# Patient Record
Sex: Female | Born: 1950 | Race: White | Hispanic: No | Marital: Married | Smoking: Never smoker
Health system: Southern US, Community
[De-identification: ages and names within clinical notes are randomized; demographics above are authoritative.]

## PROBLEM LIST (undated history)

## (undated) DIAGNOSIS — E785 Hyperlipidemia, unspecified: Secondary | ICD-10-CM

## (undated) DIAGNOSIS — G4700 Insomnia, unspecified: Secondary | ICD-10-CM

## (undated) DIAGNOSIS — Z86010 Personal history of colonic polyps: Principal | ICD-10-CM

## (undated) DIAGNOSIS — E559 Vitamin D deficiency, unspecified: Secondary | ICD-10-CM

## (undated) DIAGNOSIS — G43909 Migraine, unspecified, not intractable, without status migrainosus: Secondary | ICD-10-CM

## (undated) DIAGNOSIS — Z973 Presence of spectacles and contact lenses: Secondary | ICD-10-CM

## (undated) DIAGNOSIS — N1831 Chronic kidney disease, stage 3a: Secondary | ICD-10-CM

## (undated) DIAGNOSIS — E039 Hypothyroidism, unspecified: Secondary | ICD-10-CM

## (undated) DIAGNOSIS — K5790 Diverticulosis of intestine, part unspecified, without perforation or abscess without bleeding: Secondary | ICD-10-CM

## (undated) DIAGNOSIS — R42 Dizziness and giddiness: Secondary | ICD-10-CM

## (undated) DIAGNOSIS — K5792 Diverticulitis of intestine, part unspecified, without perforation or abscess without bleeding: Secondary | ICD-10-CM

## (undated) HISTORY — DX: Migraine, unspecified, not intractable, without status migrainosus: G43.909

## (undated) HISTORY — PX: ABDOMINAL HYSTERECTOMY: SUR658

## (undated) HISTORY — PX: COLONOSCOPY: SHX174

## (undated) HISTORY — DX: Personal history of colonic polyps: Z86.010

## (undated) HISTORY — PX: BREAST SURGERY: SHX581

## (undated) HISTORY — PX: TUBAL LIGATION: SHX77

## (undated) HISTORY — PX: TONSILLECTOMY: SUR1361

## (undated) HISTORY — DX: Insomnia, unspecified: G47.00

---
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
## 1999-06-17 ENCOUNTER — Ambulatory Visit (HOSPITAL_BASED_OUTPATIENT_CLINIC_OR_DEPARTMENT_OTHER): Admission: RE | Admit: 1999-06-17 | Discharge: 1999-06-17 | Payer: Self-pay | Admitting: General Surgery

## 1999-06-17 ENCOUNTER — Encounter: Payer: Self-pay | Admitting: General Surgery

## 2000-12-28 ENCOUNTER — Encounter (INDEPENDENT_AMBULATORY_CARE_PROVIDER_SITE_OTHER): Payer: Self-pay | Admitting: Specialist

## 2000-12-28 ENCOUNTER — Other Ambulatory Visit: Admission: RE | Admit: 2000-12-28 | Discharge: 2000-12-28 | Payer: Self-pay | Admitting: Obstetrics and Gynecology

## 2006-09-25 ENCOUNTER — Ambulatory Visit: Payer: Self-pay | Admitting: Internal Medicine

## 2006-10-12 ENCOUNTER — Ambulatory Visit: Payer: Self-pay | Admitting: Internal Medicine

## 2006-10-12 ENCOUNTER — Encounter (INDEPENDENT_AMBULATORY_CARE_PROVIDER_SITE_OTHER): Payer: Self-pay | Admitting: Specialist

## 2006-10-12 DIAGNOSIS — Z860100 Personal history of colon polyps, unspecified: Secondary | ICD-10-CM

## 2006-10-12 HISTORY — DX: Personal history of colonic polyps: Z86.010

## 2006-10-12 HISTORY — DX: Personal history of colon polyps, unspecified: Z86.0100

## 2007-06-29 ENCOUNTER — Ambulatory Visit: Payer: Self-pay

## 2010-07-18 DIAGNOSIS — G43709 Chronic migraine without aura, not intractable, without status migrainosus: Secondary | ICD-10-CM | POA: Insufficient documentation

## 2010-07-26 DIAGNOSIS — R0683 Snoring: Secondary | ICD-10-CM | POA: Insufficient documentation

## 2010-07-26 DIAGNOSIS — D126 Benign neoplasm of colon, unspecified: Secondary | ICD-10-CM | POA: Insufficient documentation

## 2010-07-26 DIAGNOSIS — N939 Abnormal uterine and vaginal bleeding, unspecified: Secondary | ICD-10-CM | POA: Insufficient documentation

## 2010-09-14 DIAGNOSIS — G4730 Sleep apnea, unspecified: Secondary | ICD-10-CM | POA: Insufficient documentation

## 2010-09-15 DIAGNOSIS — E559 Vitamin D deficiency, unspecified: Secondary | ICD-10-CM | POA: Insufficient documentation

## 2010-10-06 ENCOUNTER — Ambulatory Visit (HOSPITAL_COMMUNITY): Admission: RE | Admit: 2010-10-06 | Discharge: 2010-10-06 | Payer: Self-pay | Admitting: Obstetrics and Gynecology

## 2011-05-12 ENCOUNTER — Other Ambulatory Visit: Payer: Self-pay | Admitting: Otolaryngology

## 2011-07-20 DIAGNOSIS — R946 Abnormal results of thyroid function studies: Secondary | ICD-10-CM | POA: Insufficient documentation

## 2011-11-23 ENCOUNTER — Ambulatory Visit: Payer: Self-pay | Admitting: Obstetrics & Gynecology

## 2011-11-29 ENCOUNTER — Encounter: Payer: Self-pay | Admitting: Internal Medicine

## 2011-11-30 ENCOUNTER — Ambulatory Visit: Payer: Self-pay | Admitting: Obstetrics & Gynecology

## 2011-12-01 LAB — PATHOLOGY REPORT

## 2012-08-21 ENCOUNTER — Encounter: Payer: Self-pay | Admitting: Internal Medicine

## 2014-06-22 DIAGNOSIS — B029 Zoster without complications: Secondary | ICD-10-CM | POA: Insufficient documentation

## 2014-07-31 ENCOUNTER — Telehealth: Payer: Self-pay | Admitting: Internal Medicine

## 2014-07-31 DIAGNOSIS — E669 Obesity, unspecified: Secondary | ICD-10-CM | POA: Insufficient documentation

## 2014-07-31 NOTE — Telephone Encounter (Signed)
Patient was due in 2012.  There is a recall letter in the system.   Left message for patient to call back

## 2014-07-31 NOTE — Telephone Encounter (Signed)
I spoke with the patient.  She wants an am appt, but is not able to come on the days any of the am appts are available.  She wants to wait until December.  She will call back in a few weeks when the December schedule is opened.

## 2014-12-08 ENCOUNTER — Encounter: Payer: Self-pay | Admitting: Internal Medicine

## 2015-01-28 ENCOUNTER — Ambulatory Visit (AMBULATORY_SURGERY_CENTER): Payer: Self-pay

## 2015-01-28 VITALS — Ht 67.0 in | Wt 205.6 lb

## 2015-01-28 DIAGNOSIS — Z860100 Personal history of colon polyps, unspecified: Secondary | ICD-10-CM

## 2015-01-28 NOTE — Progress Notes (Signed)
No allergies to eggs or soy No diet/weight loss meds No home oxygen No past problems with anesthesia  Has email  Emmi instructions given for colonoscopy 

## 2015-02-11 ENCOUNTER — Ambulatory Visit (AMBULATORY_SURGERY_CENTER): Payer: Managed Care, Other (non HMO) | Admitting: Internal Medicine

## 2015-02-11 ENCOUNTER — Encounter: Payer: Self-pay | Admitting: Internal Medicine

## 2015-02-11 VITALS — BP 116/70 | HR 57 | Temp 97.3°F | Resp 27 | Ht 67.0 in | Wt 205.0 lb

## 2015-02-11 DIAGNOSIS — Z86010 Personal history of colonic polyps: Secondary | ICD-10-CM

## 2015-02-11 DIAGNOSIS — K5730 Diverticulosis of large intestine without perforation or abscess without bleeding: Secondary | ICD-10-CM

## 2015-02-11 MED ORDER — SODIUM CHLORIDE 0.9 % IV SOLN: 500.0000 mL | INTRAVENOUS | Status: DC

## 2015-02-11 NOTE — Op Note (Signed)
[facility]  [address] [place], [address]   COLONOSCOPY PROCEDURE REPORT  PATIENT: Abigail Mitchell, Abigail Mitchell  MR#: [id number] BIRTHDATE: 1951-03-30 , 59  yrs. old GENDER: female ENDOSCOPIST: Gatha Mayer, MD, [facility] PROCEDURE DATE:  02/11/2015 PROCEDURE:   Colonoscopy, surveillance First Screening Colonoscopy - Avg.  risk and is 50 yrs.  old or older - No.  Prior Negative Screening - Now for repeat screening. N/A  History of Adenoma - Now for follow-up colonoscopy & has been > or = to 3 yrs.  Yes hx of adenoma.  Has been 3 or more years since last colonoscopy. ASA CLASS:   Class II INDICATIONS:Surveillance due to prior colonic neoplasia and PH Colon Adenoma. MEDICATIONS: Propofol 330 mg IV and Monitored anesthesia care  DESCRIPTION OF PROCEDURE:   After the risks benefits and alternatives of the procedure were thoroughly explained, informed consent was obtained.  The digital rectal exam revealed no abnormalities of the rectum.   The LB [id number]  endoscope was introduced through the anus and advanced to the cecum, which was identified by both the appendix and ileocecal valve. No adverse events experienced.   The quality of the prep was excellent. (MiraLax was used)  The instrument was then slowly withdrawn as the colon was fully examined.      COLON FINDINGS: There was severe diverticulosis noted in the sigmoid colon.   The examination was otherwise normal.  Retroflexed views revealed no abnormalities. The time to cecum = 3.2 Withdrawal time = 10.3   The scope was withdrawn and the procedure completed. COMPLICATIONS: There were no immediate complications.  ENDOSCOPIC IMPRESSION: 1.   Severe diverticulosis was noted in the sigmoid colon 2.   The examination was otherwise normal  RECOMMENDATIONS: Repeat colonoscopy 10 years.  (hx diminutive adenomas 2007)  eSigned:  Gatha Mayer, MD, [facility] 02/11/2015 10:00 AM   cc: Crist Infante, MD and The  Patient

## 2015-02-11 NOTE — Patient Instructions (Signed)
Discharge instructions given. Handout on diverticulosis. Resume previous medications. YOU HAD AN ENDOSCOPIC PROCEDURE TODAY AT THE [facility]:   Refer to the procedure report that was given to you for any specific questions about what was found during the examination.  If the procedure report does not answer your questions, please call your gastroenterologist to clarify.  If you requested that your care partner not be given the details of your procedure findings, then the procedure report has been included in a sealed envelope for you to review at your convenience later.  YOU SHOULD EXPECT: Some feelings of bloating in the abdomen. Passage of more gas than usual.  Walking can help get rid of the air that was put into your GI tract during the procedure and reduce the bloating. If you had a lower endoscopy (such as a colonoscopy or flexible sigmoidoscopy) you may notice spotting of blood in your stool or on the toilet paper. If you underwent a bowel prep for your procedure, you may not have a normal bowel movement for a few days.  Please Note:  You might notice some irritation and congestion in your nose or some drainage.  This is from the oxygen used during your procedure.  There is no need for concern and it should clear up in a day or so.  SYMPTOMS TO REPORT IMMEDIATELY:   Following lower endoscopy (colonoscopy or flexible sigmoidoscopy):  Excessive amounts of blood in the stool  Significant tenderness or worsening of abdominal pains  Swelling of the abdomen that is new, acute  Fever of 100F or higher  For urgent or emergent issues, a gastroenterologist can be reached at any hour by calling [phone].   DIET: Your first meal following the procedure should be a small meal and then it is ok to progress to your normal diet. Heavy or fried foods are harder to digest and may make you feel nauseous or bloated.  Likewise, meals heavy in dairy and vegetables can increase bloating.   Drink plenty of fluids but you should avoid alcoholic beverages for 24 hours.  ACTIVITY:  You should plan to take it easy for the rest of today and you should NOT DRIVE or use heavy machinery until tomorrow (because of the sedation medicines used during the test).    FOLLOW UP: Our staff will call the number listed on your records the next business day following your procedure to check on you and address any questions or concerns that you may have regarding the information given to you following your procedure. If we do not reach you, we will leave a message.  However, if you are feeling well and you are not experiencing any problems, there is no need to return our call.  We will assume that you have returned to your regular daily activities without incident.  If any biopsies were taken you will be contacted by phone or by letter within the next 1-3 weeks.  Please call us at [phone] if you have not heard about the biopsies in 3 weeks.    SIGNATURES/CONFIDENTIALITY: You and/or your care partner have signed paperwork which will be entered into your electronic medical record.  These signatures attest to the fact that that the information above on your After Visit Summary has been reviewed and is understood.  Full responsibility of the confidentiality of this discharge information lies with you and/or your care-partner. 

## 2015-02-11 NOTE — Progress Notes (Signed)
No egg or soy allergy 

## 2015-02-11 NOTE — Progress Notes (Signed)
A/ox3, pleased with MAC, report to RN 

## 2015-02-12 ENCOUNTER — Telehealth: Payer: Self-pay | Admitting: *Deleted

## 2015-02-12 NOTE — Telephone Encounter (Signed)
  Follow up Call-  Call back number 02/11/2015  Post procedure Call Back phone  # [phone]  Permission to leave phone message Yes     Patient questions:  Do you have a fever, pain , or abdominal swelling? No. Pain Score  0 *  Have you tolerated food without any problems? Yes.    Have you been able to return to your normal activities? Yes.    Do you have any questions about your discharge instructions: Diet   No. Medications  No. Follow up visit  No.  Do you have questions or concerns about your Care? No.  Actions: * If pain score is 4 or above: No action needed, pain <4.

## 2015-03-21 NOTE — Op Note (Signed)
PATIENT NAME:  Abigail Mitchell, Abigail Mitchell MR#:  [id number] DATE OF BIRTH:  [birth year]  DATE OF PROCEDURE:  11/30/2011  PREOPERATIVE DIAGNOSES: 1. Postmenopausal bleeding. 2. Fibroids. 3. AGUS. 4. Endometrial thickening.   POSTOPERATIVE DIAGNOSES: 1. Postmenopausal bleeding. 2. Fibroids. 3. AGUS. 4. Endometrial thickening.   PROCEDURES:  1. Complete laparoscopic hysterectomy. 2. Bilateral salpingo-oophorectomy.   SURGEON: Glean Salen, MD   ASSISTANT: Malachy Mood, MD    ANESTHESIA: General.   ESTIMATED BLOOD LOSS: 25 mL.   COMPLICATIONS: None.   FINDINGS: Small fibroids on uterus, atrophic appearing ovaries.   DISPOSITION: To recovery room in stable condition.   TECHNIQUE: The patient is prepped and draped in the usual sterile fashion after adequate anesthesia is obtained in the dorsal lithotomy position. A Foley catheter is inserted. A V-care device is placed on the cervix for uterine manipulation purposes after the uterus is sounded to 7 cm.   Attention is then turned to the abdomen where a Veress needle is inserted through a 5 mm infraumbilical incision after Marcaine is used to anesthetize the skin. Veress needle placement is confirmed using the hanging drop technique and the abdomen is then insufflated with CO2 gas. A 5 mm trocar is then inserted under direct visualization with the laparoscope with no injuries or bleeding noted. The patient is placed in Trendelenburg positioning. An 11 mm trocar is placed in the right lower quadrant lateral to the inferior epigastric blood vessels and a 5 mm trocar is placed in the left lower quadrant lateral to the inferior epigastric blood vessels with no injuries or bleeding noted. The 5 mm Harmonic scalpel was used to dissect some adhesions around the left-sided sigmoid colon to pelvic sidewall and near the adnexa for complete visualization of the pelvic anatomy.   The 5 mm Harmonic scalpel is used to carefully coagulate and dissect the  infundibulopelvic blood vessels and ligaments to completely amputate the uterus, ovaries, and fallopian tubes. The round ligaments are dissected as well. Once the dissection reaches the uterine arteries towards the base of the cervix, they are carefully coagulated using the bipolar cautery device. The V-care device is noted in the cervicovaginal junction tenting the tissues and a careful incision made circumferentially using the Harmonic scalpel to completely amputate the uterus along with the adnexa. It is then removed vaginally.   A sponge is placed in the vagina to maintain pneumoperitoneum. Using the EndoStitch device and Polysorb sutures, the vaginal cuff is closed in an interrupted fashion with five sutures placed. A vaginal exam reveals excellent closure with no loss of pneumoperitoneum. The pelvic cavity is irrigated and excellent hemostasis is visualized. The bladder is filled with 120 mL of saline with no leakage and no bleeding noted in the urine. The patient is leveled, gas is expelled, trocars are removed. The skin is closed with Dermabond. The patient goes to the recovery room in stable condition. All sponge, instrument, and needle counts are correct.   ____________________________ R. Barnett Applebaum, MD rph:drc D: 11/30/2011 10:40:14 ET T: 11/30/2011 12:12:34 ET JOB#: [id number]  cc: Glean Salen, MD, <Dictator> Gae Dry MD ELECTRONICALLY SIGNED 11/30/2011 13:27

## 2015-05-24 DIAGNOSIS — H8110 Benign paroxysmal vertigo, unspecified ear: Secondary | ICD-10-CM | POA: Insufficient documentation

## 2015-09-07 DIAGNOSIS — G500 Trigeminal neuralgia: Secondary | ICD-10-CM | POA: Insufficient documentation

## 2015-12-02 DIAGNOSIS — Z0000 Encounter for general adult medical examination without abnormal findings: Secondary | ICD-10-CM | POA: Insufficient documentation

## 2015-12-02 DIAGNOSIS — R946 Abnormal results of thyroid function studies: Secondary | ICD-10-CM | POA: Diagnosis not present

## 2015-12-02 DIAGNOSIS — E559 Vitamin D deficiency, unspecified: Secondary | ICD-10-CM | POA: Diagnosis not present

## 2015-12-21 DIAGNOSIS — D126 Benign neoplasm of colon, unspecified: Secondary | ICD-10-CM | POA: Diagnosis not present

## 2015-12-21 DIAGNOSIS — E559 Vitamin D deficiency, unspecified: Secondary | ICD-10-CM | POA: Diagnosis not present

## 2015-12-21 DIAGNOSIS — Z1389 Encounter for screening for other disorder: Secondary | ICD-10-CM | POA: Diagnosis not present

## 2015-12-21 DIAGNOSIS — Z0000 Encounter for general adult medical examination without abnormal findings: Secondary | ICD-10-CM | POA: Diagnosis not present

## 2015-12-21 DIAGNOSIS — R946 Abnormal results of thyroid function studies: Secondary | ICD-10-CM | POA: Diagnosis not present

## 2015-12-21 DIAGNOSIS — B029 Zoster without complications: Secondary | ICD-10-CM | POA: Diagnosis not present

## 2015-12-21 DIAGNOSIS — M899 Disorder of bone, unspecified: Secondary | ICD-10-CM | POA: Insufficient documentation

## 2015-12-21 DIAGNOSIS — Z6833 Body mass index (BMI) 33.0-33.9, adult: Secondary | ICD-10-CM | POA: Diagnosis not present

## 2015-12-21 DIAGNOSIS — H8110 Benign paroxysmal vertigo, unspecified ear: Secondary | ICD-10-CM | POA: Diagnosis not present

## 2015-12-21 DIAGNOSIS — G43709 Chronic migraine without aura, not intractable, without status migrainosus: Secondary | ICD-10-CM | POA: Diagnosis not present

## 2015-12-21 DIAGNOSIS — Z90722 Acquired absence of ovaries, bilateral: Secondary | ICD-10-CM | POA: Insufficient documentation

## 2015-12-21 DIAGNOSIS — G500 Trigeminal neuralgia: Secondary | ICD-10-CM | POA: Diagnosis not present

## 2015-12-21 DIAGNOSIS — G4739 Other sleep apnea: Secondary | ICD-10-CM | POA: Diagnosis not present

## 2015-12-21 DIAGNOSIS — E668 Other obesity: Secondary | ICD-10-CM | POA: Diagnosis not present

## 2016-01-12 DIAGNOSIS — Z1212 Encounter for screening for malignant neoplasm of rectum: Secondary | ICD-10-CM | POA: Diagnosis not present

## 2016-02-15 DIAGNOSIS — R52 Pain, unspecified: Secondary | ICD-10-CM | POA: Insufficient documentation

## 2016-02-15 DIAGNOSIS — J029 Acute pharyngitis, unspecified: Secondary | ICD-10-CM | POA: Insufficient documentation

## 2016-02-15 DIAGNOSIS — R509 Fever, unspecified: Secondary | ICD-10-CM | POA: Diagnosis not present

## 2016-02-15 DIAGNOSIS — Z6834 Body mass index (BMI) 34.0-34.9, adult: Secondary | ICD-10-CM | POA: Diagnosis not present

## 2016-02-15 DIAGNOSIS — J020 Streptococcal pharyngitis: Secondary | ICD-10-CM | POA: Diagnosis not present

## 2016-02-23 DIAGNOSIS — H47093 Other disorders of optic nerve, not elsewhere classified, bilateral: Secondary | ICD-10-CM | POA: Diagnosis not present

## 2016-03-29 DIAGNOSIS — Z803 Family history of malignant neoplasm of breast: Secondary | ICD-10-CM | POA: Diagnosis not present

## 2016-03-29 DIAGNOSIS — Z1231 Encounter for screening mammogram for malignant neoplasm of breast: Secondary | ICD-10-CM | POA: Diagnosis not present

## 2016-05-15 DIAGNOSIS — N390 Urinary tract infection, site not specified: Secondary | ICD-10-CM | POA: Diagnosis not present

## 2016-05-15 DIAGNOSIS — R8299 Other abnormal findings in urine: Secondary | ICD-10-CM | POA: Diagnosis not present

## 2016-05-15 DIAGNOSIS — R300 Dysuria: Secondary | ICD-10-CM | POA: Insufficient documentation

## 2016-05-17 DIAGNOSIS — Z6833 Body mass index (BMI) 33.0-33.9, adult: Secondary | ICD-10-CM | POA: Diagnosis not present

## 2016-05-17 DIAGNOSIS — K5732 Diverticulitis of large intestine without perforation or abscess without bleeding: Secondary | ICD-10-CM | POA: Diagnosis not present

## 2016-05-17 DIAGNOSIS — R1032 Left lower quadrant pain: Secondary | ICD-10-CM | POA: Diagnosis not present

## 2016-05-17 DIAGNOSIS — R1031 Right lower quadrant pain: Secondary | ICD-10-CM | POA: Diagnosis not present

## 2016-05-17 DIAGNOSIS — Z1389 Encounter for screening for other disorder: Secondary | ICD-10-CM | POA: Diagnosis not present

## 2016-05-17 DIAGNOSIS — R8299 Other abnormal findings in urine: Secondary | ICD-10-CM | POA: Diagnosis not present

## 2016-08-30 DIAGNOSIS — M859 Disorder of bone density and structure, unspecified: Secondary | ICD-10-CM | POA: Diagnosis not present

## 2016-10-03 DIAGNOSIS — R1032 Left lower quadrant pain: Secondary | ICD-10-CM | POA: Diagnosis not present

## 2016-10-03 DIAGNOSIS — K5730 Diverticulosis of large intestine without perforation or abscess without bleeding: Secondary | ICD-10-CM | POA: Diagnosis not present

## 2016-10-03 DIAGNOSIS — R1031 Right lower quadrant pain: Secondary | ICD-10-CM | POA: Diagnosis not present

## 2016-10-03 DIAGNOSIS — Z6833 Body mass index (BMI) 33.0-33.9, adult: Secondary | ICD-10-CM | POA: Diagnosis not present

## 2016-10-04 DIAGNOSIS — D539 Nutritional anemia, unspecified: Secondary | ICD-10-CM | POA: Insufficient documentation

## 2016-12-18 DIAGNOSIS — R946 Abnormal results of thyroid function studies: Secondary | ICD-10-CM | POA: Diagnosis not present

## 2016-12-18 DIAGNOSIS — E559 Vitamin D deficiency, unspecified: Secondary | ICD-10-CM | POA: Diagnosis not present

## 2016-12-18 DIAGNOSIS — R8299 Other abnormal findings in urine: Secondary | ICD-10-CM | POA: Diagnosis not present

## 2016-12-18 DIAGNOSIS — E538 Deficiency of other specified B group vitamins: Secondary | ICD-10-CM | POA: Diagnosis not present

## 2016-12-18 DIAGNOSIS — Z0000 Encounter for general adult medical examination without abnormal findings: Secondary | ICD-10-CM | POA: Diagnosis not present

## 2016-12-29 DIAGNOSIS — I809 Phlebitis and thrombophlebitis of unspecified site: Secondary | ICD-10-CM | POA: Insufficient documentation

## 2016-12-29 DIAGNOSIS — Z6834 Body mass index (BMI) 34.0-34.9, adult: Secondary | ICD-10-CM | POA: Diagnosis not present

## 2016-12-29 DIAGNOSIS — Z1389 Encounter for screening for other disorder: Secondary | ICD-10-CM | POA: Diagnosis not present

## 2016-12-29 DIAGNOSIS — E668 Other obesity: Secondary | ICD-10-CM | POA: Diagnosis not present

## 2016-12-29 DIAGNOSIS — Z90722 Acquired absence of ovaries, bilateral: Secondary | ICD-10-CM | POA: Diagnosis not present

## 2016-12-29 DIAGNOSIS — N1831 Chronic kidney disease, stage 3a: Secondary | ICD-10-CM | POA: Insufficient documentation

## 2016-12-29 DIAGNOSIS — E038 Other specified hypothyroidism: Secondary | ICD-10-CM | POA: Diagnosis not present

## 2016-12-29 DIAGNOSIS — N183 Chronic kidney disease, stage 3 (moderate): Secondary | ICD-10-CM | POA: Diagnosis not present

## 2016-12-29 DIAGNOSIS — M8580 Other specified disorders of bone density and structure, unspecified site: Secondary | ICD-10-CM | POA: Diagnosis not present

## 2016-12-29 DIAGNOSIS — E559 Vitamin D deficiency, unspecified: Secondary | ICD-10-CM | POA: Diagnosis not present

## 2016-12-29 DIAGNOSIS — H8113 Benign paroxysmal vertigo, bilateral: Secondary | ICD-10-CM | POA: Diagnosis not present

## 2016-12-29 DIAGNOSIS — E538 Deficiency of other specified B group vitamins: Secondary | ICD-10-CM | POA: Diagnosis not present

## 2016-12-29 DIAGNOSIS — Z0000 Encounter for general adult medical examination without abnormal findings: Secondary | ICD-10-CM | POA: Diagnosis not present

## 2016-12-29 DIAGNOSIS — E039 Hypothyroidism, unspecified: Secondary | ICD-10-CM | POA: Insufficient documentation

## 2016-12-29 DIAGNOSIS — G43709 Chronic migraine without aura, not intractable, without status migrainosus: Secondary | ICD-10-CM | POA: Diagnosis not present

## 2017-01-01 ENCOUNTER — Other Ambulatory Visit (HOSPITAL_COMMUNITY): Payer: Self-pay | Admitting: Internal Medicine

## 2017-01-01 DIAGNOSIS — I809 Phlebitis and thrombophlebitis of unspecified site: Secondary | ICD-10-CM

## 2017-01-02 ENCOUNTER — Ambulatory Visit (HOSPITAL_COMMUNITY)
Admission: RE | Admit: 2017-01-02 | Discharge: 2017-01-02 | Disposition: A | Discharge status: Home / Self Care | Payer: PPO | Source: Ambulatory Visit | Attending: Vascular Surgery | Admitting: Vascular Surgery

## 2017-01-02 DIAGNOSIS — I82812 Embolism and thrombosis of superficial veins of left lower extremities: Secondary | ICD-10-CM | POA: Diagnosis not present

## 2017-01-02 DIAGNOSIS — I809 Phlebitis and thrombophlebitis of unspecified site: Secondary | ICD-10-CM

## 2017-01-03 DIAGNOSIS — Z1212 Encounter for screening for malignant neoplasm of rectum: Secondary | ICD-10-CM | POA: Diagnosis not present

## 2017-02-02 ENCOUNTER — Ambulatory Visit (HOSPITAL_COMMUNITY)
Admission: RE | Admit: 2017-02-02 | Discharge: 2017-02-02 | Disposition: A | Discharge status: Home / Self Care | Payer: PPO | Source: Ambulatory Visit | Attending: Vascular Surgery | Admitting: Vascular Surgery

## 2017-02-02 ENCOUNTER — Other Ambulatory Visit (HOSPITAL_COMMUNITY): Payer: Self-pay | Admitting: Internal Medicine

## 2017-02-02 DIAGNOSIS — I82812 Embolism and thrombosis of superficial veins of left lower extremities: Secondary | ICD-10-CM | POA: Diagnosis not present

## 2017-02-02 DIAGNOSIS — I80292 Phlebitis and thrombophlebitis of other deep vessels of left lower extremity: Secondary | ICD-10-CM | POA: Diagnosis not present

## 2017-06-05 DIAGNOSIS — E538 Deficiency of other specified B group vitamins: Secondary | ICD-10-CM | POA: Diagnosis not present

## 2017-06-05 DIAGNOSIS — N183 Chronic kidney disease, stage 3 (moderate): Secondary | ICD-10-CM | POA: Diagnosis not present

## 2017-06-05 DIAGNOSIS — E038 Other specified hypothyroidism: Secondary | ICD-10-CM | POA: Diagnosis not present

## 2018-01-01 DIAGNOSIS — Z1231 Encounter for screening mammogram for malignant neoplasm of breast: Secondary | ICD-10-CM | POA: Diagnosis not present

## 2018-01-17 DIAGNOSIS — E538 Deficiency of other specified B group vitamins: Secondary | ICD-10-CM | POA: Diagnosis not present

## 2018-01-17 DIAGNOSIS — E559 Vitamin D deficiency, unspecified: Secondary | ICD-10-CM | POA: Diagnosis not present

## 2018-01-17 DIAGNOSIS — E038 Other specified hypothyroidism: Secondary | ICD-10-CM | POA: Diagnosis not present

## 2018-01-17 DIAGNOSIS — R82998 Other abnormal findings in urine: Secondary | ICD-10-CM | POA: Diagnosis not present

## 2018-01-17 DIAGNOSIS — Z79899 Other long term (current) drug therapy: Secondary | ICD-10-CM | POA: Diagnosis not present

## 2018-01-24 DIAGNOSIS — N183 Chronic kidney disease, stage 3 (moderate): Secondary | ICD-10-CM | POA: Diagnosis not present

## 2018-01-24 DIAGNOSIS — D538 Other specified nutritional anemias: Secondary | ICD-10-CM | POA: Diagnosis not present

## 2018-01-24 DIAGNOSIS — K5730 Diverticulosis of large intestine without perforation or abscess without bleeding: Secondary | ICD-10-CM | POA: Diagnosis not present

## 2018-01-24 DIAGNOSIS — Z6835 Body mass index (BMI) 35.0-35.9, adult: Secondary | ICD-10-CM | POA: Diagnosis not present

## 2018-01-24 DIAGNOSIS — E038 Other specified hypothyroidism: Secondary | ICD-10-CM | POA: Diagnosis not present

## 2018-01-24 DIAGNOSIS — E538 Deficiency of other specified B group vitamins: Secondary | ICD-10-CM | POA: Diagnosis not present

## 2018-01-24 DIAGNOSIS — Z90722 Acquired absence of ovaries, bilateral: Secondary | ICD-10-CM | POA: Diagnosis not present

## 2018-01-24 DIAGNOSIS — Z1389 Encounter for screening for other disorder: Secondary | ICD-10-CM | POA: Diagnosis not present

## 2018-01-24 DIAGNOSIS — Z0000 Encounter for general adult medical examination without abnormal findings: Secondary | ICD-10-CM | POA: Diagnosis not present

## 2018-01-24 DIAGNOSIS — M859 Disorder of bone density and structure, unspecified: Secondary | ICD-10-CM | POA: Diagnosis not present

## 2018-01-24 DIAGNOSIS — R300 Dysuria: Secondary | ICD-10-CM | POA: Diagnosis not present

## 2018-01-24 DIAGNOSIS — E668 Other obesity: Secondary | ICD-10-CM | POA: Diagnosis not present

## 2018-01-30 DIAGNOSIS — Z1212 Encounter for screening for malignant neoplasm of rectum: Secondary | ICD-10-CM | POA: Diagnosis not present

## 2018-04-28 DIAGNOSIS — J0140 Acute pansinusitis, unspecified: Secondary | ICD-10-CM | POA: Diagnosis not present

## 2018-04-28 DIAGNOSIS — J209 Acute bronchitis, unspecified: Secondary | ICD-10-CM | POA: Diagnosis not present

## 2019-01-07 DIAGNOSIS — Z1231 Encounter for screening mammogram for malignant neoplasm of breast: Secondary | ICD-10-CM | POA: Diagnosis not present

## 2019-04-10 DIAGNOSIS — H5203 Hypermetropia, bilateral: Secondary | ICD-10-CM | POA: Diagnosis not present

## 2019-05-06 DIAGNOSIS — E038 Other specified hypothyroidism: Secondary | ICD-10-CM | POA: Diagnosis not present

## 2019-05-06 DIAGNOSIS — E7849 Other hyperlipidemia: Secondary | ICD-10-CM | POA: Diagnosis not present

## 2019-05-06 DIAGNOSIS — E538 Deficiency of other specified B group vitamins: Secondary | ICD-10-CM | POA: Diagnosis not present

## 2019-05-06 DIAGNOSIS — M859 Disorder of bone density and structure, unspecified: Secondary | ICD-10-CM | POA: Diagnosis not present

## 2019-05-14 DIAGNOSIS — E538 Deficiency of other specified B group vitamins: Secondary | ICD-10-CM | POA: Diagnosis not present

## 2019-05-14 DIAGNOSIS — N183 Chronic kidney disease, stage 3 (moderate): Secondary | ICD-10-CM | POA: Diagnosis not present

## 2019-05-14 DIAGNOSIS — M859 Disorder of bone density and structure, unspecified: Secondary | ICD-10-CM | POA: Diagnosis not present

## 2019-05-14 DIAGNOSIS — Z1331 Encounter for screening for depression: Secondary | ICD-10-CM | POA: Diagnosis not present

## 2019-05-14 DIAGNOSIS — H8110 Benign paroxysmal vertigo, unspecified ear: Secondary | ICD-10-CM | POA: Diagnosis not present

## 2019-05-14 DIAGNOSIS — D126 Benign neoplasm of colon, unspecified: Secondary | ICD-10-CM | POA: Diagnosis not present

## 2019-05-14 DIAGNOSIS — E785 Hyperlipidemia, unspecified: Secondary | ICD-10-CM | POA: Insufficient documentation

## 2019-05-14 DIAGNOSIS — B029 Zoster without complications: Secondary | ICD-10-CM | POA: Diagnosis not present

## 2019-05-14 DIAGNOSIS — Z0000 Encounter for general adult medical examination without abnormal findings: Secondary | ICD-10-CM | POA: Diagnosis not present

## 2019-05-14 DIAGNOSIS — Z90722 Acquired absence of ovaries, bilateral: Secondary | ICD-10-CM | POA: Diagnosis not present

## 2019-05-14 DIAGNOSIS — E669 Obesity, unspecified: Secondary | ICD-10-CM | POA: Diagnosis not present

## 2019-05-14 DIAGNOSIS — D539 Nutritional anemia, unspecified: Secondary | ICD-10-CM | POA: Diagnosis not present

## 2019-05-14 DIAGNOSIS — E039 Hypothyroidism, unspecified: Secondary | ICD-10-CM | POA: Diagnosis not present

## 2019-07-04 DIAGNOSIS — R82998 Other abnormal findings in urine: Secondary | ICD-10-CM | POA: Diagnosis not present

## 2019-12-15 DIAGNOSIS — I8002 Phlebitis and thrombophlebitis of superficial vessels of left lower extremity: Secondary | ICD-10-CM | POA: Diagnosis not present

## 2019-12-15 DIAGNOSIS — I8000 Phlebitis and thrombophlebitis of superficial vessels of unspecified lower extremity: Secondary | ICD-10-CM | POA: Insufficient documentation

## 2019-12-15 DIAGNOSIS — M79605 Pain in left leg: Secondary | ICD-10-CM | POA: Insufficient documentation

## 2020-01-13 DIAGNOSIS — L821 Other seborrheic keratosis: Secondary | ICD-10-CM | POA: Diagnosis not present

## 2020-01-13 DIAGNOSIS — D485 Neoplasm of uncertain behavior of skin: Secondary | ICD-10-CM | POA: Diagnosis not present

## 2020-01-13 DIAGNOSIS — L820 Inflamed seborrheic keratosis: Secondary | ICD-10-CM | POA: Diagnosis not present

## 2020-01-13 DIAGNOSIS — L918 Other hypertrophic disorders of the skin: Secondary | ICD-10-CM | POA: Diagnosis not present

## 2020-04-07 DIAGNOSIS — Z1231 Encounter for screening mammogram for malignant neoplasm of breast: Secondary | ICD-10-CM | POA: Diagnosis not present

## 2020-04-12 DIAGNOSIS — H43392 Other vitreous opacities, left eye: Secondary | ICD-10-CM | POA: Diagnosis not present

## 2020-04-15 DIAGNOSIS — R921 Mammographic calcification found on diagnostic imaging of breast: Secondary | ICD-10-CM | POA: Diagnosis not present

## 2020-05-03 ENCOUNTER — Other Ambulatory Visit: Payer: Self-pay | Admitting: Radiology

## 2020-05-03 DIAGNOSIS — R921 Mammographic calcification found on diagnostic imaging of breast: Secondary | ICD-10-CM | POA: Diagnosis not present

## 2020-05-03 DIAGNOSIS — D241 Benign neoplasm of right breast: Secondary | ICD-10-CM | POA: Diagnosis not present

## 2020-06-28 DIAGNOSIS — M859 Disorder of bone density and structure, unspecified: Secondary | ICD-10-CM | POA: Diagnosis not present

## 2020-06-28 DIAGNOSIS — E7849 Other hyperlipidemia: Secondary | ICD-10-CM | POA: Diagnosis not present

## 2020-06-28 DIAGNOSIS — E038 Other specified hypothyroidism: Secondary | ICD-10-CM | POA: Diagnosis not present

## 2020-06-28 DIAGNOSIS — E538 Deficiency of other specified B group vitamins: Secondary | ICD-10-CM | POA: Diagnosis not present

## 2020-07-05 DIAGNOSIS — K5730 Diverticulosis of large intestine without perforation or abscess without bleeding: Secondary | ICD-10-CM | POA: Diagnosis not present

## 2020-07-05 DIAGNOSIS — Z1331 Encounter for screening for depression: Secondary | ICD-10-CM | POA: Diagnosis not present

## 2020-07-05 DIAGNOSIS — Z1212 Encounter for screening for malignant neoplasm of rectum: Secondary | ICD-10-CM | POA: Diagnosis not present

## 2020-07-05 DIAGNOSIS — M8580 Other specified disorders of bone density and structure, unspecified site: Secondary | ICD-10-CM | POA: Diagnosis not present

## 2020-07-05 DIAGNOSIS — R82998 Other abnormal findings in urine: Secondary | ICD-10-CM | POA: Diagnosis not present

## 2020-07-05 DIAGNOSIS — N1831 Chronic kidney disease, stage 3a: Secondary | ICD-10-CM | POA: Diagnosis not present

## 2020-07-05 DIAGNOSIS — R946 Abnormal results of thyroid function studies: Secondary | ICD-10-CM | POA: Diagnosis not present

## 2020-07-05 DIAGNOSIS — E039 Hypothyroidism, unspecified: Secondary | ICD-10-CM | POA: Diagnosis not present

## 2020-07-05 DIAGNOSIS — E785 Hyperlipidemia, unspecified: Secondary | ICD-10-CM | POA: Diagnosis not present

## 2020-07-05 DIAGNOSIS — Z0000 Encounter for general adult medical examination without abnormal findings: Secondary | ICD-10-CM | POA: Diagnosis not present

## 2020-07-06 ENCOUNTER — Other Ambulatory Visit: Payer: Self-pay | Admitting: Internal Medicine

## 2020-07-06 DIAGNOSIS — E785 Hyperlipidemia, unspecified: Secondary | ICD-10-CM

## 2020-07-07 ENCOUNTER — Other Ambulatory Visit: Payer: Self-pay | Admitting: Internal Medicine

## 2020-07-07 DIAGNOSIS — M8580 Other specified disorders of bone density and structure, unspecified site: Secondary | ICD-10-CM

## 2020-07-13 ENCOUNTER — Other Ambulatory Visit: Payer: Self-pay | Admitting: Internal Medicine

## 2020-07-14 ENCOUNTER — Ambulatory Visit
Admission: RE | Admit: 2020-07-14 | Discharge: 2020-07-14 | Disposition: A | Discharge status: Home / Self Care | Payer: PPO | Source: Ambulatory Visit | Attending: Internal Medicine | Admitting: Internal Medicine

## 2020-07-14 ENCOUNTER — Other Ambulatory Visit: Payer: Self-pay

## 2020-07-14 DIAGNOSIS — M810 Age-related osteoporosis without current pathological fracture: Secondary | ICD-10-CM | POA: Diagnosis not present

## 2020-07-14 DIAGNOSIS — Z780 Asymptomatic menopausal state: Secondary | ICD-10-CM | POA: Diagnosis not present

## 2020-07-14 DIAGNOSIS — M85852 Other specified disorders of bone density and structure, left thigh: Secondary | ICD-10-CM | POA: Diagnosis not present

## 2020-07-14 DIAGNOSIS — M8580 Other specified disorders of bone density and structure, unspecified site: Secondary | ICD-10-CM

## 2020-07-15 ENCOUNTER — Other Ambulatory Visit: Payer: PPO

## 2020-07-27 ENCOUNTER — Ambulatory Visit
Admission: RE | Admit: 2020-07-27 | Discharge: 2020-07-27 | Disposition: A | Discharge status: Home / Self Care | Payer: No Typology Code available for payment source | Source: Ambulatory Visit | Attending: Internal Medicine | Admitting: Internal Medicine

## 2020-07-27 DIAGNOSIS — E7800 Pure hypercholesterolemia, unspecified: Secondary | ICD-10-CM | POA: Diagnosis not present

## 2020-07-27 DIAGNOSIS — I700 Atherosclerosis of aorta: Secondary | ICD-10-CM | POA: Diagnosis not present

## 2020-07-27 DIAGNOSIS — E785 Hyperlipidemia, unspecified: Secondary | ICD-10-CM

## 2020-07-27 DIAGNOSIS — R911 Solitary pulmonary nodule: Secondary | ICD-10-CM | POA: Diagnosis not present

## 2021-04-13 DIAGNOSIS — R928 Other abnormal and inconclusive findings on diagnostic imaging of breast: Secondary | ICD-10-CM | POA: Diagnosis not present

## 2021-04-13 DIAGNOSIS — R921 Mammographic calcification found on diagnostic imaging of breast: Secondary | ICD-10-CM | POA: Diagnosis not present

## 2021-04-21 ENCOUNTER — Other Ambulatory Visit: Payer: Self-pay

## 2021-04-21 DIAGNOSIS — N6342 Unspecified lump in left breast, subareolar: Secondary | ICD-10-CM | POA: Diagnosis not present

## 2021-04-21 DIAGNOSIS — N6012 Diffuse cystic mastopathy of left breast: Secondary | ICD-10-CM | POA: Diagnosis not present

## 2021-05-26 DIAGNOSIS — H47093 Other disorders of optic nerve, not elsewhere classified, bilateral: Secondary | ICD-10-CM | POA: Diagnosis not present

## 2021-07-05 DIAGNOSIS — E538 Deficiency of other specified B group vitamins: Secondary | ICD-10-CM | POA: Diagnosis not present

## 2021-07-05 DIAGNOSIS — E785 Hyperlipidemia, unspecified: Secondary | ICD-10-CM | POA: Diagnosis not present

## 2021-07-05 DIAGNOSIS — E559 Vitamin D deficiency, unspecified: Secondary | ICD-10-CM | POA: Diagnosis not present

## 2021-07-05 DIAGNOSIS — Z1212 Encounter for screening for malignant neoplasm of rectum: Secondary | ICD-10-CM | POA: Diagnosis not present

## 2021-07-05 DIAGNOSIS — E039 Hypothyroidism, unspecified: Secondary | ICD-10-CM | POA: Diagnosis not present

## 2021-07-05 DIAGNOSIS — Z125 Encounter for screening for malignant neoplasm of prostate: Secondary | ICD-10-CM | POA: Diagnosis not present

## 2021-07-12 DIAGNOSIS — Z23 Encounter for immunization: Secondary | ICD-10-CM | POA: Diagnosis not present

## 2021-07-12 DIAGNOSIS — Z1331 Encounter for screening for depression: Secondary | ICD-10-CM | POA: Diagnosis not present

## 2021-07-12 DIAGNOSIS — E669 Obesity, unspecified: Secondary | ICD-10-CM | POA: Diagnosis not present

## 2021-07-12 DIAGNOSIS — Z0000 Encounter for general adult medical examination without abnormal findings: Secondary | ICD-10-CM | POA: Diagnosis not present

## 2021-07-12 DIAGNOSIS — N1831 Chronic kidney disease, stage 3a: Secondary | ICD-10-CM | POA: Diagnosis not present

## 2021-07-12 DIAGNOSIS — E538 Deficiency of other specified B group vitamins: Secondary | ICD-10-CM | POA: Diagnosis not present

## 2021-07-12 DIAGNOSIS — R030 Elevated blood-pressure reading, without diagnosis of hypertension: Secondary | ICD-10-CM | POA: Diagnosis not present

## 2021-07-12 DIAGNOSIS — I700 Atherosclerosis of aorta: Secondary | ICD-10-CM | POA: Diagnosis not present

## 2021-07-12 DIAGNOSIS — E039 Hypothyroidism, unspecified: Secondary | ICD-10-CM | POA: Diagnosis not present

## 2021-07-12 DIAGNOSIS — E559 Vitamin D deficiency, unspecified: Secondary | ICD-10-CM | POA: Diagnosis not present

## 2021-07-12 DIAGNOSIS — E785 Hyperlipidemia, unspecified: Secondary | ICD-10-CM | POA: Diagnosis not present

## 2021-07-12 DIAGNOSIS — M8580 Other specified disorders of bone density and structure, unspecified site: Secondary | ICD-10-CM | POA: Diagnosis not present

## 2021-07-13 DIAGNOSIS — R82998 Other abnormal findings in urine: Secondary | ICD-10-CM | POA: Diagnosis not present

## 2021-07-21 DIAGNOSIS — Z1212 Encounter for screening for malignant neoplasm of rectum: Secondary | ICD-10-CM | POA: Diagnosis not present

## 2021-08-09 DIAGNOSIS — R030 Elevated blood-pressure reading, without diagnosis of hypertension: Secondary | ICD-10-CM | POA: Diagnosis not present

## 2021-08-09 DIAGNOSIS — N1831 Chronic kidney disease, stage 3a: Secondary | ICD-10-CM | POA: Diagnosis not present

## 2021-08-09 DIAGNOSIS — R82998 Other abnormal findings in urine: Secondary | ICD-10-CM | POA: Diagnosis not present

## 2021-08-15 DIAGNOSIS — R030 Elevated blood-pressure reading, without diagnosis of hypertension: Secondary | ICD-10-CM | POA: Diagnosis not present

## 2021-08-27 DIAGNOSIS — I809 Phlebitis and thrombophlebitis of unspecified site: Secondary | ICD-10-CM

## 2021-08-27 HISTORY — DX: Phlebitis and thrombophlebitis of unspecified site: I80.9

## 2021-09-06 ENCOUNTER — Other Ambulatory Visit: Payer: Self-pay

## 2021-09-06 ENCOUNTER — Emergency Department: Payer: PPO

## 2021-09-06 ENCOUNTER — Encounter: Payer: Self-pay | Admitting: Emergency Medicine

## 2021-09-06 DIAGNOSIS — N179 Acute kidney failure, unspecified: Secondary | ICD-10-CM | POA: Diagnosis present

## 2021-09-06 DIAGNOSIS — R5082 Postprocedural fever: Secondary | ICD-10-CM | POA: Diagnosis not present

## 2021-09-06 DIAGNOSIS — E876 Hypokalemia: Secondary | ICD-10-CM | POA: Diagnosis not present

## 2021-09-06 DIAGNOSIS — E785 Hyperlipidemia, unspecified: Secondary | ICD-10-CM | POA: Diagnosis present

## 2021-09-06 DIAGNOSIS — Z20822 Contact with and (suspected) exposure to covid-19: Secondary | ICD-10-CM | POA: Diagnosis present

## 2021-09-06 DIAGNOSIS — Z79890 Hormone replacement therapy (postmenopausal): Secondary | ICD-10-CM

## 2021-09-06 DIAGNOSIS — Z90710 Acquired absence of both cervix and uterus: Secondary | ICD-10-CM

## 2021-09-06 DIAGNOSIS — K5989 Other specified functional intestinal disorders: Secondary | ICD-10-CM | POA: Diagnosis not present

## 2021-09-06 DIAGNOSIS — K3189 Other diseases of stomach and duodenum: Secondary | ICD-10-CM | POA: Diagnosis not present

## 2021-09-06 DIAGNOSIS — K56699 Other intestinal obstruction unspecified as to partial versus complete obstruction: Secondary | ICD-10-CM | POA: Diagnosis not present

## 2021-09-06 DIAGNOSIS — Z6834 Body mass index (BMI) 34.0-34.9, adult: Secondary | ICD-10-CM

## 2021-09-06 DIAGNOSIS — K56600 Partial intestinal obstruction, unspecified as to cause: Secondary | ICD-10-CM | POA: Diagnosis present

## 2021-09-06 DIAGNOSIS — E871 Hypo-osmolality and hyponatremia: Secondary | ICD-10-CM | POA: Diagnosis not present

## 2021-09-06 DIAGNOSIS — K5732 Diverticulitis of large intestine without perforation or abscess without bleeding: Secondary | ICD-10-CM | POA: Diagnosis present

## 2021-09-06 DIAGNOSIS — E669 Obesity, unspecified: Secondary | ICD-10-CM | POA: Diagnosis present

## 2021-09-06 DIAGNOSIS — Z86010 Personal history of colonic polyps: Secondary | ICD-10-CM | POA: Diagnosis not present

## 2021-09-06 DIAGNOSIS — K5720 Diverticulitis of large intestine with perforation and abscess without bleeding: Secondary | ICD-10-CM | POA: Diagnosis not present

## 2021-09-06 DIAGNOSIS — Z79899 Other long term (current) drug therapy: Secondary | ICD-10-CM | POA: Diagnosis not present

## 2021-09-06 DIAGNOSIS — E039 Hypothyroidism, unspecified: Secondary | ICD-10-CM | POA: Diagnosis present

## 2021-09-06 DIAGNOSIS — K5730 Diverticulosis of large intestine without perforation or abscess without bleeding: Secondary | ICD-10-CM | POA: Diagnosis not present

## 2021-09-06 DIAGNOSIS — K6389 Other specified diseases of intestine: Secondary | ICD-10-CM | POA: Diagnosis not present

## 2021-09-06 DIAGNOSIS — K529 Noninfective gastroenteritis and colitis, unspecified: Secondary | ICD-10-CM | POA: Diagnosis present

## 2021-09-06 DIAGNOSIS — K5792 Diverticulitis of intestine, part unspecified, without perforation or abscess without bleeding: Secondary | ICD-10-CM | POA: Diagnosis not present

## 2021-09-06 DIAGNOSIS — N189 Chronic kidney disease, unspecified: Secondary | ICD-10-CM | POA: Diagnosis not present

## 2021-09-06 DIAGNOSIS — K56609 Unspecified intestinal obstruction, unspecified as to partial versus complete obstruction: Secondary | ICD-10-CM | POA: Diagnosis not present

## 2021-09-06 DIAGNOSIS — R1032 Left lower quadrant pain: Secondary | ICD-10-CM | POA: Diagnosis present

## 2021-09-06 DIAGNOSIS — R9431 Abnormal electrocardiogram [ECG] [EKG]: Secondary | ICD-10-CM | POA: Diagnosis not present

## 2021-09-06 LAB — TROPONIN I (HIGH SENSITIVITY): Troponin I (High Sensitivity): 3 ng/L (ref <18)

## 2021-09-06 LAB — COMPREHENSIVE METABOLIC PANEL
ALT: 19 U/L (ref 0–44)
AST: 23 U/L (ref 15–41)
Albumin: 4.1 g/dL (ref 3.5–5.0)
Alkaline Phosphatase: 62 U/L (ref 38–126)
Anion gap: 12 (ref 5–15)
BUN: 18 mg/dL (ref 8–23)
CO2: 20 mmol/L — ABNORMAL LOW (ref 22–32)
Calcium: 8.7 mg/dL — ABNORMAL LOW (ref 8.9–10.3)
Chloride: 105 mmol/L (ref 98–111)
Creatinine, Ser: 1.26 mg/dL — ABNORMAL HIGH (ref 0.44–1.00)
GFR, Estimated: 46 mL/min — ABNORMAL LOW (ref >60)
Glucose, Bld: 128 mg/dL — ABNORMAL HIGH (ref 70–99)
Potassium: 3.7 mmol/L (ref 3.5–5.1)
Sodium: 137 mmol/L (ref 135–145)
Total Bilirubin: 0.8 mg/dL (ref 0.3–1.2)
Total Protein: 7.6 g/dL (ref 6.5–8.1)

## 2021-09-06 LAB — URINALYSIS, COMPLETE (UACMP) WITH MICROSCOPIC
Glucose, UA: NEGATIVE mg/dL
Hgb urine dipstick: NEGATIVE
Ketones, ur: 20 mg/dL — AB
Nitrite: NEGATIVE
Protein, ur: 100 mg/dL — AB
Specific Gravity, Urine: 1.034 — ABNORMAL HIGH (ref 1.005–1.030)
pH: 5 (ref 5.0–8.0)

## 2021-09-06 LAB — CBC
HCT: 44.1 % (ref 36.0–46.0)
Hemoglobin: 15.8 g/dL — ABNORMAL HIGH (ref 12.0–15.0)
MCH: 34.9 pg — ABNORMAL HIGH (ref 26.0–34.0)
MCHC: 35.8 g/dL (ref 30.0–36.0)
MCV: 97.4 fL (ref 80.0–100.0)
Platelets: 256 10*3/uL (ref 150–400)
RBC: 4.53 MIL/uL (ref 3.87–5.11)
RDW: 12.2 % (ref 11.5–15.5)
WBC: 12.1 10*3/uL — ABNORMAL HIGH (ref 4.0–10.5)
nRBC: 0 % (ref 0.0–0.2)

## 2021-09-06 LAB — LIPASE, BLOOD: Lipase: 32 U/L (ref 11–51)

## 2021-09-06 NOTE — ED Triage Notes (Signed)
Pt to ED from home c/o upper and lower abd pain since Sunday.  Hx of diverticulitis and PCP called in 2 ABX which are not helping.  Pt states this feels worse than times before.  Nausea today with 1 episode vomiting, denies diarrhea.  Last normal BM yesterday.  Denies urinary changes, denies chest pain, states the intermittent cramping makes her feel SOB.  Pt A&Ox4, chest rise even and unlabored, skin WNL and in NAD at this time.

## 2021-09-07 ENCOUNTER — Encounter: Payer: Self-pay | Admitting: Family Medicine

## 2021-09-07 ENCOUNTER — Inpatient Hospital Stay
Admission: EM | Admit: 2021-09-07 | Discharge: 2021-09-14 | DRG: 330 | Disposition: A | Discharge status: Home Health | Payer: PPO | Attending: Internal Medicine | Admitting: Internal Medicine

## 2021-09-07 ENCOUNTER — Inpatient Hospital Stay: Payer: PPO

## 2021-09-07 DIAGNOSIS — K56609 Unspecified intestinal obstruction, unspecified as to partial versus complete obstruction: Secondary | ICD-10-CM

## 2021-09-07 DIAGNOSIS — R1032 Left lower quadrant pain: Secondary | ICD-10-CM

## 2021-09-07 DIAGNOSIS — E871 Hypo-osmolality and hyponatremia: Secondary | ICD-10-CM | POA: Diagnosis not present

## 2021-09-07 DIAGNOSIS — E039 Hypothyroidism, unspecified: Secondary | ICD-10-CM | POA: Diagnosis present

## 2021-09-07 DIAGNOSIS — K5792 Diverticulitis of intestine, part unspecified, without perforation or abscess without bleeding: Secondary | ICD-10-CM | POA: Diagnosis not present

## 2021-09-07 DIAGNOSIS — K529 Noninfective gastroenteritis and colitis, unspecified: Secondary | ICD-10-CM

## 2021-09-07 DIAGNOSIS — K56600 Partial intestinal obstruction, unspecified as to cause: Secondary | ICD-10-CM

## 2021-09-07 DIAGNOSIS — Z79890 Hormone replacement therapy (postmenopausal): Secondary | ICD-10-CM | POA: Diagnosis not present

## 2021-09-07 DIAGNOSIS — Z86010 Personal history of colonic polyps: Secondary | ICD-10-CM | POA: Diagnosis not present

## 2021-09-07 DIAGNOSIS — E876 Hypokalemia: Secondary | ICD-10-CM | POA: Diagnosis not present

## 2021-09-07 DIAGNOSIS — K5732 Diverticulitis of large intestine without perforation or abscess without bleeding: Secondary | ICD-10-CM | POA: Diagnosis present

## 2021-09-07 DIAGNOSIS — E785 Hyperlipidemia, unspecified: Secondary | ICD-10-CM | POA: Diagnosis present

## 2021-09-07 DIAGNOSIS — Z90710 Acquired absence of both cervix and uterus: Secondary | ICD-10-CM | POA: Diagnosis not present

## 2021-09-07 DIAGNOSIS — Z79899 Other long term (current) drug therapy: Secondary | ICD-10-CM | POA: Diagnosis not present

## 2021-09-07 DIAGNOSIS — Z20822 Contact with and (suspected) exposure to covid-19: Secondary | ICD-10-CM | POA: Diagnosis present

## 2021-09-07 DIAGNOSIS — N189 Chronic kidney disease, unspecified: Secondary | ICD-10-CM | POA: Diagnosis not present

## 2021-09-07 DIAGNOSIS — K5720 Diverticulitis of large intestine with perforation and abscess without bleeding: Secondary | ICD-10-CM | POA: Diagnosis not present

## 2021-09-07 DIAGNOSIS — N179 Acute kidney failure, unspecified: Secondary | ICD-10-CM | POA: Diagnosis present

## 2021-09-07 DIAGNOSIS — E669 Obesity, unspecified: Secondary | ICD-10-CM | POA: Diagnosis present

## 2021-09-07 DIAGNOSIS — R5082 Postprocedural fever: Secondary | ICD-10-CM | POA: Diagnosis not present

## 2021-09-07 DIAGNOSIS — Z6834 Body mass index (BMI) 34.0-34.9, adult: Secondary | ICD-10-CM | POA: Diagnosis not present

## 2021-09-07 HISTORY — DX: Diverticulosis of intestine, part unspecified, without perforation or abscess without bleeding: K57.90

## 2021-09-07 LAB — CBC
HCT: 41 % (ref 36.0–46.0)
Hemoglobin: 14 g/dL (ref 12.0–15.0)
MCH: 34.1 pg — ABNORMAL HIGH (ref 26.0–34.0)
MCHC: 34.1 g/dL (ref 30.0–36.0)
MCV: 100 fL (ref 80.0–100.0)
Platelets: 220 10*3/uL (ref 150–400)
RBC: 4.1 MIL/uL (ref 3.87–5.11)
RDW: 12.4 % (ref 11.5–15.5)
WBC: 12.2 10*3/uL — ABNORMAL HIGH (ref 4.0–10.5)
nRBC: 0 % (ref 0.0–0.2)

## 2021-09-07 LAB — BASIC METABOLIC PANEL
Anion gap: 5 (ref 5–15)
BUN: 18 mg/dL (ref 8–23)
CO2: 25 mmol/L (ref 22–32)
Calcium: 7.9 mg/dL — ABNORMAL LOW (ref 8.9–10.3)
Chloride: 108 mmol/L (ref 98–111)
Creatinine, Ser: 1.15 mg/dL — ABNORMAL HIGH (ref 0.44–1.00)
GFR, Estimated: 51 mL/min — ABNORMAL LOW (ref >60)
Glucose, Bld: 109 mg/dL — ABNORMAL HIGH (ref 70–99)
Potassium: 4.2 mmol/L (ref 3.5–5.1)
Sodium: 138 mmol/L (ref 135–145)

## 2021-09-07 LAB — RESP PANEL BY RT-PCR (FLU A&B, COVID) ARPGX2
Influenza A by PCR: NEGATIVE
Influenza B by PCR: NEGATIVE
SARS Coronavirus 2 by RT PCR: NEGATIVE

## 2021-09-07 LAB — GLUCOSE, CAPILLARY: Glucose-Capillary: 121 mg/dL — ABNORMAL HIGH (ref 70–99)

## 2021-09-07 LAB — PROCALCITONIN: Procalcitonin: <0.1 ng/mL

## 2021-09-07 LAB — HIV ANTIBODY (ROUTINE TESTING W REFLEX): HIV Screen 4th Generation wRfx: NONREACTIVE

## 2021-09-07 MED ORDER — ROSUVASTATIN CALCIUM 10 MG PO TABS
10.0000 mg | ORAL_TABLET | Freq: Every day | ORAL | Status: DC
  Administered 2021-09-07 – 2021-09-08 (×2): 10 mg via ORAL
  Filled 2021-09-07 (×3): qty 1

## 2021-09-07 MED ORDER — ONDANSETRON HCL 4 MG/2ML IJ SOLN
4.0000 mg | Freq: Once | INTRAMUSCULAR | Status: AC
  Administered 2021-09-07: 4 mg via INTRAVENOUS
  Filled 2021-09-07: qty 2

## 2021-09-07 MED ORDER — LEVOTHYROXINE SODIUM 50 MCG PO TABS
50.0000 ug | ORAL_TABLET | Freq: Every day | ORAL | Status: DC
  Administered 2021-09-07 – 2021-09-14 (×8): 50 ug via ORAL
  Filled 2021-09-07 (×8): qty 1

## 2021-09-07 MED ORDER — MONTELUKAST SODIUM 10 MG PO TABS
10.0000 mg | ORAL_TABLET | Freq: Every day | ORAL | Status: DC
  Administered 2021-09-07 – 2021-09-13 (×7): 10 mg via ORAL
  Filled 2021-09-07 (×7): qty 1

## 2021-09-07 MED ORDER — POLYETHYLENE GLYCOL 3350 17 G PO PACK
17.0000 g | PACK | Freq: Two times a day (BID) | ORAL | Status: DC
  Administered 2021-09-07: 17 g via ORAL
  Filled 2021-09-07: qty 1

## 2021-09-07 MED ORDER — POLYETHYLENE GLYCOL 3350 17 G PO PACK
34.0000 g | PACK | Freq: Two times a day (BID) | ORAL | Status: DC
  Administered 2021-09-07 – 2021-09-08 (×3): 34 g via ORAL
  Filled 2021-09-07 (×4): qty 2

## 2021-09-07 MED ORDER — ACETAMINOPHEN 500 MG PO TABS: 500.0000 mg | ORAL_TABLET | Freq: Four times a day (QID) | ORAL | Status: DC | PRN

## 2021-09-07 MED ORDER — ZOLPIDEM TARTRATE 5 MG PO TABS
5.0000 mg | ORAL_TABLET | Freq: Every evening | ORAL | Status: DC | PRN
  Administered 2021-09-12 – 2021-09-13 (×2): 5 mg via ORAL
  Filled 2021-09-07 (×2): qty 1

## 2021-09-07 MED ORDER — SODIUM CHLORIDE 0.9 % IV SOLN: INTRAVENOUS | Status: DC

## 2021-09-07 MED ORDER — MORPHINE SULFATE (PF) 2 MG/ML IV SOLN
2.0000 mg | INTRAVENOUS | Status: DC | PRN
  Administered 2021-09-07 – 2021-09-08 (×5): 2 mg via INTRAVENOUS
  Filled 2021-09-07 (×5): qty 1

## 2021-09-07 MED ORDER — SODIUM CHLORIDE 0.9 % IV BOLUS
1000.0000 mL | Freq: Once | INTRAVENOUS | Status: AC
  Administered 2021-09-07: 1000 mL via INTRAVENOUS

## 2021-09-07 MED ORDER — ACETAMINOPHEN 325 MG PO TABS
650.0000 mg | ORAL_TABLET | Freq: Four times a day (QID) | ORAL | Status: DC | PRN
  Administered 2021-09-10 – 2021-09-13 (×2): 650 mg via ORAL
  Filled 2021-09-07 (×2): qty 2

## 2021-09-07 MED ORDER — ONDANSETRON HCL 4 MG PO TABS: 4.0000 mg | ORAL_TABLET | Freq: Four times a day (QID) | ORAL | Status: DC | PRN

## 2021-09-07 MED ORDER — PIPERACILLIN-TAZOBACTAM 3.375 G IVPB
3.3750 g | Freq: Three times a day (TID) | INTRAVENOUS | Status: DC
  Administered 2021-09-07 – 2021-09-14 (×22): 3.375 g via INTRAVENOUS
  Filled 2021-09-07 (×23): qty 50

## 2021-09-07 MED ORDER — PIPERACILLIN-TAZOBACTAM 3.375 G IVPB 30 MIN
3.3750 g | Freq: Once | INTRAVENOUS | Status: AC
  Administered 2021-09-07: 3.375 g via INTRAVENOUS
  Filled 2021-09-07: qty 50

## 2021-09-07 MED ORDER — ONDANSETRON HCL 4 MG/2ML IJ SOLN
4.0000 mg | Freq: Four times a day (QID) | INTRAMUSCULAR | Status: DC | PRN
  Administered 2021-09-09 – 2021-09-11 (×2): 4 mg via INTRAVENOUS
  Filled 2021-09-07: qty 2

## 2021-09-07 MED ORDER — ACETAMINOPHEN 650 MG RE SUPP
650.0000 mg | Freq: Four times a day (QID) | RECTAL | Status: DC | PRN
  Filled 2021-09-07: qty 1

## 2021-09-07 MED ORDER — ENOXAPARIN SODIUM 60 MG/0.6ML IJ SOSY
0.5000 mg/kg | PREFILLED_SYRINGE | INTRAMUSCULAR | Status: DC
  Administered 2021-09-08: 09:00:00 50 mg via SUBCUTANEOUS
  Filled 2021-09-07: qty 0.6

## 2021-09-07 MED ORDER — MAGNESIUM HYDROXIDE 400 MG/5ML PO SUSP
30.0000 mL | Freq: Every day | ORAL | Status: DC | PRN
  Administered 2021-09-08 – 2021-09-11 (×2): 30 mL via ORAL
  Filled 2021-09-07 (×3): qty 30

## 2021-09-07 MED ORDER — MORPHINE SULFATE (PF) 4 MG/ML IV SOLN
4.0000 mg | Freq: Once | INTRAVENOUS | Status: AC
  Administered 2021-09-07: 4 mg via INTRAVENOUS
  Filled 2021-09-07: qty 1

## 2021-09-07 NOTE — Progress Notes (Signed)
Anticoagulation monitoring(Lovenox):  70 yo female ordered Lovenox 40 mg Q24h    Filed Weights   09/06/21 1921  Weight: 98 kg (216 lb)   BMI  34.86  Lab Results  Component Value Date   CREATININE 1.26 (H) 09/06/2021   Estimated Creatinine Clearance: 49.1 mL/min (A) (by C-G formula based on SCr of 1.26 mg/dL (H)). Hemoglobin & Hematocrit     Component Value Date/Time   HGB 15.8 (H) 09/06/2021 1927   HGB 12.7 12/01/2011 0727   HCT 44.1 09/06/2021 1927     Per Protocol for Patient with estCrcl > 30 ml/min and BMI > 30, will transition to Lovenox 50 mg Q24h.

## 2021-09-07 NOTE — H&P (Signed)
[place]   PATIENT NAME: Abigail Mitchell    MR#:  [id number]  DATE OF BIRTH:  08-Nov-1951  DATE OF ADMISSION:  09/07/2021  PRIMARY CARE PHYSICIAN: Crist Infante, MD   Patient is coming from: Home.  REQUESTING/REFERRING PHYSICIAN: Lurline Hare, MD  CHIEF COMPLAINT:   Chief Complaint  Patient presents with   Abdominal Pain    HISTORY OF PRESENT ILLNESS:  Abigail Mitchell is a 70 y.o. female with medical history significant for diverticulitis, dyslipidemia and hypothyroidism, who presented to emergency room with acute onset of lower abdominal pain worse in the left lower quadrant with associated vomiting and bloating.  She denies any fever or chills.  No melena or bright red bleeding per rectum.  No chest pain or dyspnea.  No cough or wheezing.  No dysuria, oliguria, hematuria or flank pain.  ED Course: On presentation to the emergency room vital signs were within normal.  Labs revealed CMP remarkable for creatinine 1.26 and CO2 of 20.  Lipase was 32.  CBC showed mild cytosis of 12.1 and hemoconcentration.  Influenza antigens and COVID-19 PCR came back negative.  UA was unremarkable.    EKG showed normal sinus rhythm with a rate of 85 with left axis deviation and low voltage QRS and poor R wave progression Imaging: Abdominal pelvic CT showed moderate severity colitis and/or diverticulitis within the proximal sigmoid colon and secondary distal partial large bowel obstruction with recommendation for follow-up to exclude obstructing mass within the sigmoid colon.  The patient was given 4 mg of IV morphine sulfate, IV Zosyn, 4 mg IV Zofran and 1 L bolus of IV normal saline.  She will be admitted to a medical bed for further evaluation and management. PAST MEDICAL HISTORY:   Past Medical History:  Diagnosis Date   Diverticulosis    Hx of colonic polyps - diminutive adenomas 10/12/2006   Insomnia     PAST SURGICAL HISTORY:   Past Surgical History:  Procedure Laterality Date   ABDOMINAL  HYSTERECTOMY     BREAST SURGERY     calcification   CESAREAN SECTION     COLONOSCOPY     TONSILLECTOMY     TUBAL LIGATION      SOCIAL HISTORY:   Social History   Tobacco Use   Smoking status: Never   Smokeless tobacco: Never  Substance Use Topics   Alcohol use: No    Alcohol/week: 0.0 standard drinks    FAMILY HISTORY:   Family History  Problem Relation Age of Onset   Colon cancer Neg Hx    Breast cancer Mother    Brain cancer Maternal Grandfather    Other Brother        rectal tumor     DRUG ALLERGIES:  No Known Allergies  REVIEW OF SYSTEMS:   ROS As per history of present illness. All pertinent systems were reviewed above. Constitutional, HEENT, cardiovascular, respiratory, GI, GU, musculoskeletal, neuro, psychiatric, endocrine, integumentary and hematologic systems were reviewed and are otherwise negative/unremarkable except for positive findings mentioned above in the HPI.   MEDICATIONS AT HOME:   Prior to Admission medications   Medication Sig Start Date End Date Taking? Authorizing Provider  acetaminophen (TYLENOL) 500 MG tablet Take 500 mg by mouth every 6 (six) hours as needed.   Yes [provider]  ciprofloxacin (CIPRO) 500 MG tablet Take 500 mg by mouth 2 (two) times daily. 09/05/21  Yes [provider]  levothyroxine (SYNTHROID) 50 MCG tablet Take 50  mcg by mouth daily. 06/27/21  Yes [provider]  metroNIDAZOLE (FLAGYL) 500 MG tablet Take 500 mg by mouth 3 (three) times daily. 09/05/21  Yes [provider]  montelukast (SINGULAIR) 10 MG tablet Take 10 mg by mouth daily as needed. 06/24/21  Yes [provider]  rosuvastatin (CRESTOR) 10 MG tablet Take 10 mg by mouth daily. 07/27/21  Yes [provider]  loratadine (CLARITIN) 10 MG tablet Take 10 mg by mouth daily. Patient not taking: No sig reported    [provider]  Rio sleep aide Patient not taking: No sig  reported    [provider]  Vitamin D, Ergocalciferol, (DRISDOL) 50000 UNITS CAPS capsule Take 50,000 Units by mouth every 7 (seven) days.    [provider]      VITAL SIGNS:  Blood pressure (!) 139/57, pulse 65, temperature 97.8 F (36.6 C), resp. rate 17, height 5\' 6"  (1.676 m), weight 98 kg, SpO2 100 %.  PHYSICAL EXAMINATION:  Physical Exam  GENERAL:  70 y.o.-year-old Caucasian female patient lying in the bed with no acute distress.  EYES: Pupils equal, round, reactive to light and accommodation. No scleral icterus. Extraocular muscles intact.  HEENT: Head atraumatic, normocephalic. Oropharynx and nasopharynx clear.  NECK:  Supple, no jugular venous distention. No thyroid enlargement, no tenderness.  LUNGS: Normal breath sounds bilaterally, no wheezing, rales,rhonchi or crepitation. No use of accessory muscles of respiration.  CARDIOVASCULAR: Regular rate and rhythm, S1, S2 normal. No murmurs, rubs, or gallops.  ABDOMEN: Soft, distended with mild generalized tenderness without rebound tenderness guarding or rigidity and diminished bowel sounds.  I could not palpate organomegaly or masses.   EXTREMITIES: No pedal edema, cyanosis, or clubbing.  NEUROLOGIC: Cranial nerves II through XII are intact. Muscle strength 5/5 in all extremities. Sensation intact. Gait not checked.  PSYCHIATRIC: The patient is alert and oriented x 3.  Normal affect and good eye contact. SKIN: No obvious rash, lesion, or ulcer.   LABORATORY PANEL:   CBC Recent Labs  Lab 09/06/21 1927  WBC 12.1*  HGB 15.8*  HCT 44.1  PLT 256   ------------------------------------------------------------------------------------------------------------------  Chemistries  Recent Labs  Lab 09/06/21 1927  NA 137  K 3.7  CL 105  CO2 20*  GLUCOSE 128*  BUN 18  CREATININE 1.26*  CALCIUM 8.7*  AST 23  ALT 19  ALKPHOS 62  BILITOT 0.8    ------------------------------------------------------------------------------------------------------------------  Cardiac Enzymes No results for input(s): TROPONINI in the last 168 hours. ------------------------------------------------------------------------------------------------------------------  RADIOLOGY:  CT Abdomen Pelvis Wo Contrast  Result Date: 09/06/2021 CLINICAL DATA:  Upper and lower abdominal pain. EXAM: CT ABDOMEN AND PELVIS WITHOUT CONTRAST TECHNIQUE: Multidetector CT imaging of the abdomen and pelvis was performed following the standard protocol without IV contrast. COMPARISON:  November 16, 2010 FINDINGS: Lower chest: No acute abnormality. Hepatobiliary: No focal liver abnormality is seen. No gallstones, gallbladder wall thickening, or biliary dilatation. Pancreas: Unremarkable. No pancreatic ductal dilatation or surrounding inflammatory changes. Spleen: Normal in size without focal abnormality. Adrenals/Urinary Tract: Adrenal glands are unremarkable. Kidneys are normal in size, without renal calculi or hydronephrosis. A 9 mm cystic appearing areas seen within the anterolateral aspect of the mid right kidney. A the urinary bladder is empty and subsequently limited in evaluation. Stomach/Bowel: Stomach is within normal limits. Appendix appears normal. The small bowel loops are normal in caliber. Moderate to markedly distended large bowel loops are seen from the level of the cecum to the proximal sigmoid  colon. An abrupt transition zone is seen at this level (axial CT images 75 through 78, CT series 2). A segment of moderately thickened proximal sigmoid colon is seen at this level. Numerous diverticula are also seen throughout the sigmoid colon. Vascular/Lymphatic: No significant vascular findings are present. No enlarged abdominal or pelvic lymph nodes. Reproductive: Status post hysterectomy. No adnexal masses. Other: No abdominal wall hernia or abnormality. No abdominopelvic  ascites. Musculoskeletal: No acute or significant osseous findings. IMPRESSION: 1. Moderate severity colitis and/or diverticulitis within the proximal sigmoid colon with secondary distal partial large bowel obstruction. Follow-up to resolution is recommended to exclude the presence of an obstructing mass lesion within the sigmoid colon. Electronically Signed   By: Virgina Norfolk M.D.   On: 09/06/2021 23:31      IMPRESSION AND PLAN:  Active Problems:   Acute diverticulitis  1.  Acute diverticulitis with associated colitis. - The patient will be admitted to a medical bed. - We will continue antibiotic therapy with IV Zosyn. - Pain management will be provided. - The patient will be kept n.p.o. for now.  2.  Partial small bowel obstruction with mild acute kidney injury. - We will keep her n.p.o. as mentioned above. - We will hydrate with IV normal saline. - We will follow BMP. - We will follow-up two-view abdomen x-ray in a.m. - General surgery consult to be obtained. - I notified Dr. Christian Mate with the patient.  3.  Dyslipidemia. - We will continue statin therapy.  4.  Hypothyroidism. - We will continue Synthroid.  DVT prophylaxis: Lovenox. Code Status: full code. Family Communication:  The plan of care was discussed in details with the patient (and family). I answered all questions. The patient agreed to proceed with the above mentioned plan. Further management will depend upon hospital course. Disposition Plan: Back to previous home environment Consults called: General surgery All the records are reviewed and case discussed with ED provider.  Status is: Inpatient  Remains inpatient appropriate because:Ongoing active pain requiring inpatient pain management, Ongoing diagnostic testing needed not appropriate for outpatient work up, Unsafe d/c plan, IV treatments appropriate due to intensity of illness or inability to take PO, and Inpatient level of care appropriate due to  severity of illness  Dispo: The patient is from: Home              Anticipated d/c is to: Home              Patient currently is not medically stable to d/c.   Difficult to place patient No    TOTAL TIME TAKING CARE OF THIS PATIENT: 55 minutes.    Christel Mormon M.D on 09/07/2021 at 5:38 AM  [facility]   From 7 PM-7 AM, contact night-coverage [email or web address]  CC: Primary care physician; Crist Infante, MD

## 2021-09-07 NOTE — Consult Note (Signed)
[facility] SURGICAL CONSULTATION NOTE (initial) - cpt: [id number]   HISTORY OF PRESENT ILLNESS (HPI):  70 y.o. female presented to [facility] ED overnight for evaluation of abdominal pain. Patient reports the acute onset of generalized abdominal pain since Sunday with associated progressive abdominal distension and nausea. She now has somewhat diffuse abdominal pain and significant distension. She is not passing flatus and has not had a bowel movement in the last 2 days. She denied any associated fever, chills, CP, SOB, emesis, or urinary changes. Prior to this, she states she had normal bowel movements without blood. Last colonoscopy was 8 years ago and reportedly normal. No FHx of colon CA. Previous intra-abdominal surgeries including c-section and hysterectomy. Work up in the ED revealed a mild leukocytosis to 12.1K, AKI with sCr - 1.26, and CT Abdomen/Pelvis was concerning for large bowel obstruction and possible diverticulitis vs colitis vs mass.   Surgery is consulted by hospitalist physician Dr. Eugenie Norrie, MD in this context for evaluation and management of possible large bowel obstruction.   PAST MEDICAL HISTORY (PMH):  Past Medical History:  Diagnosis Date   Diverticulosis    Hx of colonic polyps - diminutive adenomas 10/12/2006   Insomnia      PAST SURGICAL HISTORY ([place]):  Past Surgical History:  Procedure Laterality Date   ABDOMINAL HYSTERECTOMY     BREAST SURGERY     calcification   CESAREAN SECTION     COLONOSCOPY     TONSILLECTOMY     TUBAL LIGATION       MEDICATIONS:  Prior to Admission medications   Medication Sig Start Date End Date Taking? Authorizing Provider  acetaminophen (TYLENOL) 500 MG tablet Take 500 mg by mouth every 6 (six) hours as needed.   Yes [provider]  ciprofloxacin (CIPRO) 500 MG tablet Take 500 mg by mouth 2 (two) times daily. 09/05/21  Yes [provider]  levothyroxine (SYNTHROID) 50 MCG tablet Take 50 mcg by mouth  daily. 06/27/21  Yes [provider]  metroNIDAZOLE (FLAGYL) 500 MG tablet Take 500 mg by mouth 3 (three) times daily. 09/05/21  Yes [provider]  montelukast (SINGULAIR) 10 MG tablet Take 10 mg by mouth daily as needed. 06/24/21  Yes [provider]  rosuvastatin (CRESTOR) 10 MG tablet Take 10 mg by mouth daily. 07/27/21  Yes [provider]  loratadine (CLARITIN) 10 MG tablet Take 10 mg by mouth daily. Patient not taking: No sig reported    [provider]  [place] sleep aide Patient not taking: No sig reported    [provider]  Vitamin D, Ergocalciferol, (DRISDOL) 50000 UNITS CAPS capsule Take 50,000 Units by mouth every 7 (seven) days.    [provider]     ALLERGIES:  No Known Allergies   SOCIAL HISTORY:  Social History   Socioeconomic History   Marital status: Married    Spouse name: Not on file   Number of children: Not on file   Years of education: Not on file   Highest education level: Not on file  Occupational History   Not on file  Tobacco Use   Smoking status: Never   Smokeless tobacco: Never  Substance and Sexual Activity   Alcohol use: No    Alcohol/week: 0.0 standard drinks   Drug use: No   Sexual activity: Not on file  Other Topics Concern   Not on file  Social History Narrative   Not on file   Social  Determinants of Health   Financial Resource Strain: Not on file  Food Insecurity: Not on file  Transportation Needs: Not on file  Physical Activity: Not on file  Stress: Not on file  Social Connections: Not on file  Intimate Partner Violence: Not on file     FAMILY HISTORY:  Family History  Problem Relation Age of Onset   Colon cancer Neg Hx    Breast cancer Mother    Brain cancer Maternal Grandfather    Other Brother        rectal tumor       REVIEW OF SYSTEMS:  Review of Systems  Constitutional:  Negative for chills and fever.  HENT:  Negative for  congestion and sore throat.   Respiratory:  Negative for cough and shortness of breath.   Cardiovascular:  Negative for chest pain and palpitations.  Gastrointestinal:  Positive for abdominal pain, constipation and nausea. Negative for diarrhea and vomiting.  Genitourinary:  Negative for dysuria and urgency.  All other systems reviewed and are negative.  VITAL SIGNS:  Temp:  [97.8 F (36.6 C)-98.6 F (37 C)] 97.8 F (36.6 C) (10/12 0420) Pulse Rate:  [65-85] 65 (10/12 0420) Resp:  [16-18] 17 (10/12 0420) BP: (112-139)/(57-73) 139/57 (10/12 0420) SpO2:  [96 %-100 %] 100 % (10/12 0420) Weight:  [98 kg] 98 kg (10/11 1921)     Height: 5\' 6"  (167.6 cm) Weight: 98 kg BMI (Calculated): 34.88   INTAKE/OUTPUT:  10/11 0701 - 10/12 0700 In: 254.3 [I.V.:254.3] Out: -   PHYSICAL EXAM:  Physical Exam Vitals and nursing note reviewed. Exam conducted with a chaperone present.  Constitutional:      General: She is not in acute distress.    Appearance: She is well-developed. She is not ill-appearing.  HENT:     Head: Normocephalic and atraumatic.  Eyes:     General: No scleral icterus.    Extraocular Movements: Extraocular movements intact.  Cardiovascular:     Rate and Rhythm: Normal rate and regular rhythm.     Heart sounds: Normal heart sounds. No murmur heard. Pulmonary:     Effort: Pulmonary effort is normal. No respiratory distress.     Breath sounds: Normal breath sounds.  Abdominal:     General: Abdomen is protuberant. There is distension.     Tenderness: There is generalized abdominal tenderness. There is no guarding or rebound.     Comments: Abdomen is firm, markedly distended, she is somewhat tender throughout but actually appears worse on the right  Genitourinary:    Comments: Deferred Skin:    General: Skin is warm and dry.     Coloration: Skin is not jaundiced or pale.  Neurological:     General: No focal deficit present.     Mental Status: She is alert and oriented to  person, place, and time.  Psychiatric:        Mood and Affect: Mood normal.        Behavior: Behavior normal.     Labs:  CBC Latest Ref Rng & Units 09/07/2021 09/06/2021 12/01/2011  WBC 4.0 - 10.5 K/uL 12.2(H) 12.1(H) -  Hemoglobin 12.0 - 15.0 g/dL 14.0 15.8(H) 12.7  Hematocrit 36.0 - 46.0 % 41.0 44.1 -  Platelets 150 - 400 K/uL 220 256 -   CMP Latest Ref Rng & Units 09/07/2021 09/06/2021  Glucose 70 - 99 mg/dL 109(H) 128(H)  BUN 8 - 23 mg/dL 18 18  Creatinine 0.44 - 1.00 mg/dL 1.15(H) 1.26(H)  Sodium 135 -  145 mmol/L 138 137  Potassium 3.5 - 5.1 mmol/L 4.2 3.7  Chloride 98 - 111 mmol/L 108 105  CO2 22 - 32 mmol/L 25 20(L)  Calcium 8.9 - 10.3 mg/dL 7.9(L) 8.7(L)  Total Protein 6.5 - 8.1 g/dL - 7.6  Total Bilirubin 0.3 - 1.2 mg/dL - 0.8  Alkaline Phos 38 - 126 U/L - 62  AST 15 - 41 U/L - 23  ALT 0 - 44 U/L - 19     Imaging studies:   CT Abdomen/Pelvis (09/06/2021) personally reviewed showing dilated large bowel with transition in the distal proximal sigmoid colon with diverticula present concerning for potential large bowel obstruction:  IMPRESSION: 1. Moderate severity colitis and/or diverticulitis within the proximal sigmoid colon with secondary distal partial large bowel obstruction. Follow-up to resolution is recommended to exclude the presence of an obstructing mass lesion within the sigmoid colon.   Assessment/Plan: (ICD-10's: K29.609) 70 y.o. female with abdominal pain and distension found to have a large bowel obstruction thought to be secondary to possible diverticulitis; however she is non-tender on the LLQ and there is minimal, if any, stranding on CT. My worry is this my represent a completely obstructing stricture or mass.    - Appreciate medicine admission - I will obtain a BE to evaluate her distal colon for complete obstruction vs mass   - Pending the BE, I do worry that she will need more urgent surgical intervention (ex: possible colectomy) at some point  this admission  - NPO for now + IVF resuscitation - Continue IV Abx (Zosyn) - Monitor abdominal examination; on-going bowel function - Pain control prn; antiemetics prn  - DVT prophylaxis; hold for potential need for intervention  - Further management per primary service; we will follow   All of the above findings and recommendations were discussed with the patient, and all of patient's questions were answered to her expressed satisfaction.  Thank you for the opportunity to participate in this patient's care.   -- Edison Simon, PA-C [facility] 09/07/2021, 7:15 AM [phone] M-F: 7am - 4pm

## 2021-09-07 NOTE — Plan of Care (Signed)

## 2021-09-07 NOTE — ED Provider Notes (Signed)
[facility] Emergency Department Provider Note   ____________________________________________   Event Date/Time   First MD Initiated Contact with Patient 09/07/21 0033     (approximate)  I have reviewed the triage vital signs and the nursing notes.   HISTORY  Chief Complaint Abdominal Pain    HPI Abigail Mitchell is a 70 y.o. female who presents to the ED from home with a chief complaint of abdominal pain.  Patient with a history of diverticulitis who developed left lower quadrant abdominal pain 3 days ago.  Her PCP called in prescriptions for Cipro and Flagyl which patient has been taking.  She felt worse today with 1 episode of emesis in the afternoon.  Denies diarrhea; last BM yesterday which was normal for patient.  Denies fever, chills, cough, chest pain, shortness of breath, dysuria.      Past Medical History:  Diagnosis Date   Diverticulosis    Hx of colonic polyps - diminutive adenomas 10/12/2006   Insomnia     Patient Active Problem List   Diagnosis Date Noted   Acute diverticulitis 09/07/2021   Hx of colonic polyps - diminutive adenomas 10/12/2006    Past Surgical History:  Procedure Laterality Date   ABDOMINAL HYSTERECTOMY     BREAST SURGERY     calcification   CESAREAN SECTION     COLONOSCOPY     TONSILLECTOMY     TUBAL LIGATION      Prior to Admission medications   Medication Sig Start Date End Date Taking? Authorizing Provider  acetaminophen (TYLENOL) 500 MG tablet Take 500 mg by mouth every 6 (six) hours as needed.    [provider]  ciprofloxacin (CIPRO) 500 MG tablet Take 500 mg by mouth 2 (two) times daily. 09/05/21   [provider]  levothyroxine (SYNTHROID) 50 MCG tablet Take 50 mcg by mouth daily. 06/27/21   [provider]  loratadine (CLARITIN) 10 MG tablet Take 10 mg by mouth daily.    [provider]  metroNIDAZOLE (FLAGYL) 500 MG tablet Take 500 mg by mouth 3 (three) times daily.  09/05/21   [provider]  montelukast (SINGULAIR) 10 MG tablet Take 10 mg by mouth daily as needed. 06/24/21   [provider]  OVER THE COUNTER MEDICATION Equate sleep aide    [provider]  rosuvastatin (CRESTOR) 10 MG tablet Take 10 mg by mouth daily. 07/27/21   [provider]  Vitamin D, Ergocalciferol, (DRISDOL) 50000 UNITS CAPS capsule Take 50,000 Units by mouth every 7 (seven) days.    [provider]    Allergies Patient has no known allergies.  Family History  Problem Relation Age of Onset   Colon cancer Neg Hx    Breast cancer Mother    Brain cancer Maternal Grandfather    Other Brother        rectal tumor     Social History Social History   Tobacco Use   Smoking status: Never   Smokeless tobacco: Never  Substance Use Topics   Alcohol use: No    Alcohol/week: 0.0 standard drinks   Drug use: No    Review of Systems  Constitutional: No fever/chills Eyes: No visual changes. ENT: No sore throat. Cardiovascular: Denies chest pain. Respiratory: Denies shortness of breath. Gastrointestinal: Positive for abdominal pain and 1 episode of vomiting.  No diarrhea.  No constipation. Genitourinary: Negative for dysuria. Musculoskeletal: Negative for back pain. Skin: Negative for rash. Neurological: Negative for headaches, focal weakness or  numbness.   ____________________________________________   PHYSICAL EXAM:  VITAL SIGNS: ED Triage Vitals  Enc Vitals Group     BP 09/06/21 1923 112/73     Pulse Rate 09/06/21 1923 85     Resp 09/06/21 1923 16     Temp 09/06/21 1923 98.6 F (37 C)     Temp Source 09/06/21 1923 Oral     SpO2 09/06/21 1923 96 %     Weight 09/06/21 1921 216 lb (98 kg)     Height 09/06/21 1921 5\' 6"  (1.676 m)     Head Circumference --      Peak Flow --      Pain Score 09/06/21 1921 7     Pain Loc --      Pain Edu? --      Excl. in [place]? --     Constitutional: Alert and oriented. Well appearing  and in mild acute distress. Eyes: Conjunctivae are normal. PERRL. EOMI. Head: Atraumatic. Nose: No congestion/rhinnorhea. Mouth/Throat: Mucous membranes are moist.   Neck: No stridor.   Cardiovascular: Normal rate, regular rhythm. Grossly normal heart sounds.  Good peripheral circulation. Respiratory: Normal respiratory effort.  No retractions. Lungs CTAB. Gastrointestinal: Soft and diffusely tender maximally in left lower quadrant without rebound or guarding.  Moderate distention. No abdominal bruits. No CVA tenderness. Musculoskeletal: No lower extremity tenderness nor edema.  No joint effusions. Neurologic:  Normal speech and language. No gross focal neurologic deficits are appreciated.  Skin:  Skin is warm, dry and intact. No rash noted. Psychiatric: Mood and affect are normal. Speech and behavior are normal.  ____________________________________________   LABS (all labs ordered are listed, but only abnormal results are displayed)  Labs Reviewed  COMPREHENSIVE METABOLIC PANEL - Abnormal; Notable for the following components:      Result Value   CO2 20 (*)    Glucose, Bld 128 (*)    Creatinine, Ser 1.26 (*)    Calcium 8.7 (*)    GFR, Estimated 46 (*)    All other components within normal limits  CBC - Abnormal; Notable for the following components:   WBC 12.1 (*)    Hemoglobin 15.8 (*)    MCH 34.9 (*)    All other components within normal limits  URINALYSIS, COMPLETE (UACMP) WITH MICROSCOPIC - Abnormal; Notable for the following components:   Color, Urine AMBER (*)    APPearance HAZY (*)    Specific Gravity, Urine 1.034 (*)    Bilirubin Urine MODERATE (*)    Ketones, ur 20 (*)    Protein, ur 100 (*)    Leukocytes,Ua SMALL (*)    Bacteria, UA RARE (*)    All other components within normal limits  RESP PANEL BY RT-PCR (FLU A&B, COVID) ARPGX2  LIPASE, BLOOD  TROPONIN I (HIGH SENSITIVITY)   ____________________________________________  EKG  ED ECG REPORT I,  Xavious Sharrar J, the attending physician, personally viewed and interpreted this ECG.   Date: 09/07/2021  EKG Time: 1926  Rate: 85  Rhythm: normal sinus rhythm  Axis: LAD  Intervals:none  ST&T Change: Nonspecific  ____________________________________________  RADIOLOGY Cecilie Kicks J, personally viewed and evaluated these images (plain radiographs) as part of my medical decision making, as well as reviewing the written report by the radiologist.  ED MD interpretation: Diverticulitis with partial SBO  Official radiology report(s): CT Abdomen Pelvis Wo Contrast  Result Date: 09/06/2021 CLINICAL DATA:  Upper and lower abdominal pain. EXAM: CT ABDOMEN AND PELVIS WITHOUT CONTRAST TECHNIQUE: Multidetector CT imaging of  the abdomen and pelvis was performed following the standard protocol without IV contrast. COMPARISON:  November 16, 2010 FINDINGS: Lower chest: No acute abnormality. Hepatobiliary: No focal liver abnormality is seen. No gallstones, gallbladder wall thickening, or biliary dilatation. Pancreas: Unremarkable. No pancreatic ductal dilatation or surrounding inflammatory changes. Spleen: Normal in size without focal abnormality. Adrenals/Urinary Tract: Adrenal glands are unremarkable. Kidneys are normal in size, without renal calculi or hydronephrosis. A 9 mm cystic appearing areas seen within the anterolateral aspect of the mid right kidney. A the urinary bladder is empty and subsequently limited in evaluation. Stomach/Bowel: Stomach is within normal limits. Appendix appears normal. The small bowel loops are normal in caliber. Moderate to markedly distended large bowel loops are seen from the level of the cecum to the proximal sigmoid colon. An abrupt transition zone is seen at this level (axial CT images 75 through 78, CT series 2). A segment of moderately thickened proximal sigmoid colon is seen at this level. Numerous diverticula are also seen throughout the sigmoid colon. Vascular/Lymphatic:  No significant vascular findings are present. No enlarged abdominal or pelvic lymph nodes. Reproductive: Status post hysterectomy. No adnexal masses. Other: No abdominal wall hernia or abnormality. No abdominopelvic ascites. Musculoskeletal: No acute or significant osseous findings. IMPRESSION: 1. Moderate severity colitis and/or diverticulitis within the proximal sigmoid colon with secondary distal partial large bowel obstruction. Follow-up to resolution is recommended to exclude the presence of an obstructing mass lesion within the sigmoid colon. Electronically Signed   By: Virgina Norfolk M.D.   On: 09/06/2021 23:31    ____________________________________________   PROCEDURES  Procedure(s) performed (including Critical Care):  .1-3 Lead EKG Interpretation Performed by: Paulette Blanch, MD Authorized by: Paulette Blanch, MD     Interpretation: normal     ECG rate:  85   ECG rate assessment: normal     Rhythm: sinus rhythm     Ectopy: none     Conduction: normal   Comments:     Patient placed on cardiac monitor to evaluate for arrhythmias   ____________________________________________   INITIAL IMPRESSION / ASSESSMENT AND PLAN / ED COURSE  As part of my medical decision making, I reviewed the following data within the [facility] notes reviewed and incorporated, Labs reviewed, EKG interpreted, Old chart reviewed, Radiograph reviewed, Discussed with admitting physician, and Notes from prior ED visits     70 year old female presenting with lower abdominal pain, history of diverticulitis. Differential diagnosis includes, but is not limited to, ovarian cyst, ovarian torsion, acute appendicitis, diverticulitis, urinary tract infection/pyelonephritis, endometriosis, bowel obstruction, colitis, renal colic, gastroenteritis, hernia, fibroids, endometriosis, pregnancy related pain including ectopic pregnancy, etc.   Laboratory results demonstrate mild leukocytosis,  ketonuria.  CT scan demonstrates diverticulitis with partial SBO.  Will administer IV fluid resuscitation, IV morphine for pain paired with IV Zofran for nausea.  IV Zosyn for broad-spectrum antibiotic coverage.  Hold NG tube for now as patient is not vomiting.  Will discuss with hospitalist services for admission.      ____________________________________________   FINAL CLINICAL IMPRESSION(S) / ED DIAGNOSES  Final diagnoses:  Left lower quadrant abdominal pain  Diverticulitis  Partial small bowel obstruction [facility])     ED Discharge Orders     None        Note:  This document was prepared using Dragon voice recognition software and may include unintentional dictation errors.    Paulette Blanch, MD 09/07/21 [phone]

## 2021-09-08 DIAGNOSIS — K5792 Diverticulitis of intestine, part unspecified, without perforation or abscess without bleeding: Secondary | ICD-10-CM | POA: Diagnosis not present

## 2021-09-08 LAB — CBC
HCT: 38.3 % (ref 36.0–46.0)
Hemoglobin: 13.5 g/dL (ref 12.0–15.0)
MCH: 35.2 pg — ABNORMAL HIGH (ref 26.0–34.0)
MCHC: 35.2 g/dL (ref 30.0–36.0)
MCV: 99.7 fL (ref 80.0–100.0)
Platelets: 203 10*3/uL (ref 150–400)
RBC: 3.84 MIL/uL — ABNORMAL LOW (ref 3.87–5.11)
RDW: 12.6 % (ref 11.5–15.5)
WBC: 8.9 10*3/uL (ref 4.0–10.5)
nRBC: 0 % (ref 0.0–0.2)

## 2021-09-08 LAB — BASIC METABOLIC PANEL
Anion gap: 4 — ABNORMAL LOW (ref 5–15)
BUN: 12 mg/dL (ref 8–23)
CO2: 25 mmol/L (ref 22–32)
Calcium: 7.9 mg/dL — ABNORMAL LOW (ref 8.9–10.3)
Chloride: 103 mmol/L (ref 98–111)
Creatinine, Ser: 1.06 mg/dL — ABNORMAL HIGH (ref 0.44–1.00)
GFR, Estimated: 57 mL/min — ABNORMAL LOW (ref >60)
Glucose, Bld: 110 mg/dL — ABNORMAL HIGH (ref 70–99)
Potassium: 3.6 mmol/L (ref 3.5–5.1)
Sodium: 132 mmol/L — ABNORMAL LOW (ref 135–145)

## 2021-09-08 LAB — MAGNESIUM: Magnesium: 2.5 mg/dL — ABNORMAL HIGH (ref 1.7–2.4)

## 2021-09-08 MED ORDER — ENOXAPARIN SODIUM 40 MG/0.4ML IJ SOSY: 40.0000 mg | PREFILLED_SYRINGE | INTRAMUSCULAR | Status: DC

## 2021-09-08 NOTE — Consult Note (Signed)
[facility] Nurse requested for preoperative stoma site marking. Patient receiving care in [facility].  Discussed surgical procedure and stoma creation with patient and family (one of her daughters was present).  Explained role of the [facility] nurse team.  Provided the patient with educational booklet and provided samples of pouching options.  Answered patient and family questions.   Examined patient lying and sitting in order to place the marking in the patient's visual field, away from any creases or abdominal contour issues and within the rectus muscle.  Attempted to mark above the patient's belt line.   Marked for colostomy in the LUQ  __5__ cm to the left of the umbilicus and __7__JL above the umbilicus.  The abdomen was significantly distended. We spent some time talking about how the contour of the abdomen will likely change once the surgery is completed and the distension subsides.  Patient's abdomen cleansed with CHG wipes at site markings, allowed to air dry prior to marking.Covered mark with thin film transparent dressing to preserve mark until date of surgery.   [facility] Nurse team will follow up with patient after surgery for continue ostomy care and teaching.  Val Riles, RN, MSN, CWOCN, CNS-BC, pager [phone]

## 2021-09-08 NOTE — Progress Notes (Signed)
[facility] SURGICAL PROGRESS NOTE (cpt [phone])  Hospital Day(s): 1.  Interval History: Patient seen and examined, no acute events or new complaints overnight. Patient reports she continues to have right sided abdominal pain, distension, and nausea. She denies fever, chills, CP, SOB. Previously seen leukocytosis is now resolved, down to 8.9K. renal function improved some; sCr - 1.06; UO - unmeasured. Hyponatremia to 132. CEA is pending. She is on CLD; but not eating much. Continues to be without flatus. Plan for Hartmann's Procedure tomorrow with Dr Christian Mate; she is expectedly anxious.    Review of Systems:  Constitutional: denies fever, chills  HEENT: denies cough or congestion  Respiratory: denies any shortness of breath  Cardiovascular: denies chest pain or palpitations  Gastrointestinal: + abdominal pain, + nausea, + distension Genitourinary: denies burning with urination or urinary frequency  Vital signs in last 24 hours: [min-max] current  Temp:  [97.8 F (36.6 C)-99.2 F (37.3 C)] 98.6 F (37 C) (10/13 0349) Pulse Rate:  [62-67] 66 (10/13 0349) Resp:  [16-19] 16 (10/13 0349) BP: (90-157)/(37-78) 90/37 (10/13 0349) SpO2:  [96 %-100 %] 96 % (10/13 0349)     Height: 5\' 6"  (167.6 cm) Weight: 98 kg BMI (Calculated): 34.88   Intake/Output last 2 shifts:  10/12 0701 - 10/13 0700 In: 1111.6 [P.O.:720; I.V.:345.5; IV Piggyback:46.1] Out: -    Physical Exam:  Constitutional: alert, cooperative and no distress  HENT: normocephalic without obvious abnormality  Eyes: PERRL, EOM's grossly intact and symmetric  Respiratory: breathing non-labored at rest  Cardiovascular: regular rate and sinus rhythm  Gastrointestinal: Abdomen is firm, markedly distended and tympanic, she is again interestingly more tender on the right, no rebound/guarding  Musculoskeletal: no edema or wounds, motor and sensation grossly intact, NT    Labs:  CBC Latest Ref Rng & Units 09/08/2021  09/07/2021 09/06/2021  WBC 4.0 - 10.5 K/uL 8.9 12.2(H) 12.1(H)  Hemoglobin 12.0 - 15.0 g/dL 13.5 14.0 15.8(H)  Hematocrit 36.0 - 46.0 % 38.3 41.0 44.1  Platelets 150 - 400 K/uL 203 220 256   CMP Latest Ref Rng & Units 09/08/2021 09/07/2021 09/06/2021  Glucose 70 - 99 mg/dL 110(H) 109(H) 128(H)  BUN 8 - 23 mg/dL 12 18 18   Creatinine 0.44 - 1.00 mg/dL 1.06(H) 1.15(H) 1.26(H)  Sodium 135 - 145 mmol/L 132(L) 138 137  Potassium 3.5 - 5.1 mmol/L 3.6 4.2 3.7  Chloride 98 - 111 mmol/L 103 108 105  CO2 22 - 32 mmol/L 25 25 20(L)  Calcium 8.9 - 10.3 mg/dL 7.9(L) 7.9(L) 8.7(L)  Total Protein 6.5 - 8.1 g/dL - - 7.6  Total Bilirubin 0.3 - 1.2 mg/dL - - 0.8  Alkaline Phos 38 - 126 U/L - - 62  AST 15 - 41 U/L - - 23  ALT 0 - 44 U/L - - 19     Imaging studies: No new pertinent imaging studies   Assessment/Plan: (ICD-10's: K47.609) 70 y.o. female with abdominal pain and distension found to have a large bowel obstruction likely secondary to obstructing mass rather than diverticulitis as initially thought.    - Plan for exploratory laparotomy and colectomy with end colostomy (Hartmann's Procedure) with Dr Christian Mate tomorrow (10/14) - All risks, benefits, and alternatives to above procedure(s) were discussed with the patient, all of her questions were answered to her expressed satisfaction, patient expresses she wishes to proceed, and informed consent was obtained.    - Okay for clear liquids today as tolerated; NPO at midnight    - Continue IVF  resuscitation - Continue IV Abx (Zosyn) - Monitor abdominal examination; on-going bowel function - Pain control prn; antiemetics prn             - DVT prophylaxis; hold for surgery tomorrow              - Further management per primary service; we will follow    All of the above findings and recommendations were discussed with the patient, and all of patient's questions were answered to her expressed satisfaction.  -- Edison Simon, PA-C [facility] 09/08/2021, 7:46 AM [phone] M-F: 7am - 4pm

## 2021-09-08 NOTE — Progress Notes (Signed)
PROGRESS NOTE    Abigail Mitchell  JJO:[id number] DOB: 14-Apr-1951 DOA: 09/07/2021 PCP: Crist Infante, MD  [id number]   Assessment & Plan:   Active Problems:   Acute diverticulitis   Abigail Mitchell is a 70 y.o. female with medical history significant for diverticulitis, dyslipidemia and hypothyroidism, who presented to emergency room with acute onset of lower abdominal pain worse in the left lower quadrant with associated vomiting and bloating.   1.  Acute diverticulitis with associated colitis. --cont IV zosyn for now  Large bowel obstruction --concerning for obstructing mass --clear liquid diet, per GenSurg --COLECTOMY WITH COLOSTOMY CREATION tomorrow  3.  Dyslipidemia. --cont statin  4.  Hypothyroidism. --cont Synthroid   DVT prophylaxis: SCD/Compression stockings Code Status: Full code  Family Communication:  Level of care: Med-Surg Dispo:   The patient is from: home Anticipated d/c is to: home Anticipated d/c date is: >3 days Patient currently is not medically ready to d/c due to: pending surgery for bowel resection   Subjective and Interval History:  Pt still had not passed gas, no BM.  Complained of feeling distended in her abdomen.  No nausea, still tolerating clear liquid.   Objective: Vitals:   09/08/21 0349 09/08/21 0904 09/08/21 1309 09/08/21 1733  BP: (!) 90/37 116/62 131/60 (!) 115/56  Pulse: 66 68 70 69  Resp: 16 20 16 20   Temp: 98.6 F (37 C) 97.6 F (36.4 C) 98.1 F (36.7 C) 98 F (36.7 C)  TempSrc: Oral  Oral   SpO2: 96% 96% 99% 98%  Weight:      Height:        Intake/Output Summary (Last 24 hours) at 09/08/2021 1854 Last data filed at 09/08/2021 1844 Gross per 24 hour  Intake 200 ml  Output --  Net 200 ml   Filed Weights   09/06/21 1921  Weight: 98 kg    Examination:   Constitutional: NAD, AAOx3 HEENT: conjunctivae and lids normal, EOMI CV: No cyanosis.   RESP: normal respiratory effort, on RA GI: abdomen  distended Extremities: No effusions, edema in BLE SKIN: warm, dry Neuro: II - XII grossly intact.   Psych: Normal mood and affect.  Appropriate judgement and reason   Data Reviewed: I have personally reviewed following labs and imaging studies  CBC: Recent Labs  Lab 09/06/21 1927 09/07/21 0535 09/08/21 0513  WBC 12.1* 12.2* 8.9  HGB 15.8* 14.0 13.5  HCT 44.1 41.0 38.3  MCV 97.4 100.0 99.7  PLT 256 220 160   Basic Metabolic Panel: Recent Labs  Lab 09/06/21 1927 09/07/21 0535 09/08/21 0513  NA 137 138 132*  K 3.7 4.2 3.6  CL 105 108 103  CO2 20* 25 25  GLUCOSE 128* 109* 110*  BUN 18 18 12   CREATININE 1.26* 1.15* 1.06*  CALCIUM 8.7* 7.9* 7.9*  MG  --   --  2.5*   GFR: Estimated Creatinine Clearance: 58.3 mL/min (A) (by C-G formula based on SCr of 1.06 mg/dL (H)). Liver Function Tests: Recent Labs  Lab 09/06/21 1927  AST 23  ALT 19  ALKPHOS 62  BILITOT 0.8  PROT 7.6  ALBUMIN 4.1   Recent Labs  Lab 09/06/21 1927  LIPASE 32   No results for input(s): AMMONIA in the last 168 hours. Coagulation Profile: No results for input(s): INR, PROTIME in the last 168 hours. Cardiac Enzymes: No results for input(s): CKTOTAL, CKMB, CKMBINDEX, TROPONINI in the last 168 hours. BNP (last 3 results) No results for input(s): PROBNP in the  last 8760 hours. HbA1C: No results for input(s): HGBA1C in the last 72 hours. CBG: Recent Labs  Lab 09/07/21 1620  GLUCAP 121*   Lipid Profile: No results for input(s): CHOL, HDL, LDLCALC, TRIG, CHOLHDL, LDLDIRECT in the last 72 hours. Thyroid Function Tests: No results for input(s): TSH, T4TOTAL, FREET4, T3FREE, THYROIDAB in the last 72 hours. Anemia Panel: No results for input(s): VITAMINB12, FOLATE, FERRITIN, TIBC, IRON, RETICCTPCT in the last 72 hours. Sepsis Labs: Recent Labs  Lab 09/07/21 [place] <0.10    Recent Results (from the past 240 hour(s))  Resp Panel by RT-PCR (Flu A&B, Covid) Nasopharyngeal Swab      Status: None   Collection Time: 09/07/21 12:52 AM   Specimen: Nasopharyngeal Swab; Nasopharyngeal(NP) swabs in vial transport medium  Result Value Ref Range Status   SARS Coronavirus 2 by RT PCR NEGATIVE NEGATIVE Final    Comment: (NOTE) SARS-CoV-2 target nucleic acids are NOT DETECTED.  The SARS-CoV-2 RNA is generally detectable in upper respiratory specimens during the acute phase of infection. The lowest concentration of SARS-CoV-2 viral copies this assay can detect is 138 copies/mL. A negative result does not preclude SARS-Cov-2 infection and should not be used as the sole basis for treatment or other patient management decisions. A negative result may occur with  improper specimen collection/handling, submission of specimen other than nasopharyngeal swab, presence of viral mutation(s) within the areas targeted by this assay, and inadequate number of viral copies(<138 copies/mL). A negative result must be combined with clinical observations, patient history, and epidemiological information. The expected result is Negative.  Fact Sheet for Patients:  EntrepreneurPulse.com.au  Fact Sheet for Healthcare Providers:  IncredibleEmployment.be  This test is no t yet approved or cleared by the [place] FDA and  has been authorized for detection and/or diagnosis of SARS-CoV-2 by FDA under an Emergency Use Authorization (EUA). This EUA will remain  in effect (meaning this test can be used) for the duration of the COVID-19 declaration under Section 564(b)(1) of the Act, 21 U.S.C.section 360bbb-3(b)(1), unless the authorization is terminated  or revoked sooner.       Influenza A by PCR NEGATIVE NEGATIVE Final   Influenza B by PCR NEGATIVE NEGATIVE Final    Comment: (NOTE) The Xpert Xpress SARS-CoV-2/FLU/RSV plus assay is intended as an aid in the diagnosis of influenza from Nasopharyngeal swab specimens and should not be used as a sole basis  for treatment. Nasal washings and aspirates are unacceptable for Xpert Xpress SARS-CoV-2/FLU/RSV testing.  Fact Sheet for Patients: EntrepreneurPulse.com.au  Fact Sheet for Healthcare Providers: IncredibleEmployment.be  This test is not yet approved or cleared by the [place] FDA and has been authorized for detection and/or diagnosis of SARS-CoV-2 by FDA under an Emergency Use Authorization (EUA). This EUA will remain in effect (meaning this test can be used) for the duration of the COVID-19 declaration under Section 564(b)(1) of the Act, 21 U.S.C. section 360bbb-3(b)(1), unless the authorization is terminated or revoked.  Performed at [facility], [address] [place],  [place] [address]       Radiology Studies: CT Abdomen Pelvis Wo Contrast  Result Date: 09/06/2021 CLINICAL DATA:  Upper and lower abdominal pain. EXAM: CT ABDOMEN AND PELVIS WITHOUT CONTRAST TECHNIQUE: Multidetector CT imaging of the abdomen and pelvis was performed following the standard protocol without IV contrast. COMPARISON:  November 16, 2010 FINDINGS: Lower chest: No acute abnormality. Hepatobiliary: No focal liver abnormality is seen. No gallstones, gallbladder wall thickening, or biliary dilatation. Pancreas: Unremarkable.  No pancreatic ductal dilatation or surrounding inflammatory changes. Spleen: Normal in size without focal abnormality. Adrenals/Urinary Tract: Adrenal glands are unremarkable. Kidneys are normal in size, without renal calculi or hydronephrosis. A 9 mm cystic appearing areas seen within the anterolateral aspect of the mid right kidney. A the urinary bladder is empty and subsequently limited in evaluation. Stomach/Bowel: Stomach is within normal limits. Appendix appears normal. The small bowel loops are normal in caliber. Moderate to markedly distended large bowel loops are seen from the level of the cecum to the proximal sigmoid colon. An  abrupt transition zone is seen at this level (axial CT images 75 through 78, CT series 2). A segment of moderately thickened proximal sigmoid colon is seen at this level. Numerous diverticula are also seen throughout the sigmoid colon. Vascular/Lymphatic: No significant vascular findings are present. No enlarged abdominal or pelvic lymph nodes. Reproductive: Status post hysterectomy. No adnexal masses. Other: No abdominal wall hernia or abnormality. No abdominopelvic ascites. Musculoskeletal: No acute or significant osseous findings. IMPRESSION: 1. Moderate severity colitis and/or diverticulitis within the proximal sigmoid colon with secondary distal partial large bowel obstruction. Follow-up to resolution is recommended to exclude the presence of an obstructing mass lesion within the sigmoid colon. Electronically Signed   By: Virgina Norfolk M.D.   On: 09/06/2021 23:31   DG BE (COLON)W SINGLE CM (SOL OR THIN BA)  Result Date: 09/07/2021 CLINICAL DATA:  Large bowel obstruction EXAM: SINGLE CONTRAST BARIUM ENEMA TECHNIQUE: Initial scout AP supine abdominal image obtained to insure adequate colon cleansing. Barium was introduced into the colon in a retrograde fashion and refluxed from the rectum to the sigmoid colon. Spot images of the colon followed by overhead radiographs were obtained. FLUOROSCOPY TIME:  Fluoroscopy Time:  54 seconds Radiation Exposure Index (if provided by the fluoroscopic device): 36.10 mGy COMPARISON:  Correlation made with prior CT FINDINGS: Scout radiographs of the abdomen demonstrate dilated, air-filled colon to the level of the sigmoid. There is an area of circumferential narrowing of the sigmoid colon measuring approximately 4.6 cm in length maximal. Lumen is focally narrowed to 5 mm. Contrast does pass through this area into the more proximal sigmoid. Scattered diverticula noted. IMPRESSION: Segmental circumferential narrowing of the sigmoid colon as described with proximal colonic  dilatation. Scattered diverticula noted. There are no substantial acute inflammatory changes on the prior CT and tissue sampling is recommended when possible to evaluate for malignancy. Electronically Signed   By: Macy Mis M.D.   On: 09/07/2021 11:22     Scheduled Meds:  levothyroxine  50 mcg Oral Q0600   montelukast  10 mg Oral QHS   polyethylene glycol  34 g Oral BID   rosuvastatin  10 mg Oral Daily   Continuous Infusions:  piperacillin-tazobactam 3.375 g (09/08/21 1508)     LOS: 1 day     Enzo Bi, MD [facility] If 7PM-7AM, please contact night-coverage 09/08/2021, 6:54 PM

## 2021-09-09 ENCOUNTER — Encounter
Admission: EM | Disposition: A | Discharge status: Home Health | Payer: Self-pay | Source: Home / Self Care | Attending: Hospitalist

## 2021-09-09 ENCOUNTER — Inpatient Hospital Stay: Payer: PPO | Admitting: Registered Nurse

## 2021-09-09 ENCOUNTER — Other Ambulatory Visit: Payer: Self-pay

## 2021-09-09 ENCOUNTER — Encounter: Payer: Self-pay | Admitting: Family Medicine

## 2021-09-09 DIAGNOSIS — K5720 Diverticulitis of large intestine with perforation and abscess without bleeding: Secondary | ICD-10-CM | POA: Diagnosis not present

## 2021-09-09 HISTORY — PX: COLECTOMY WITH COLOSTOMY CREATION/HARTMANN PROCEDURE: SHX6598

## 2021-09-09 LAB — MAGNESIUM: Magnesium: 2.5 mg/dL — ABNORMAL HIGH (ref 1.7–2.4)

## 2021-09-09 LAB — BASIC METABOLIC PANEL
Anion gap: 9 (ref 5–15)
BUN: 13 mg/dL (ref 8–23)
CO2: 24 mmol/L (ref 22–32)
Calcium: 8.1 mg/dL — ABNORMAL LOW (ref 8.9–10.3)
Chloride: 101 mmol/L (ref 98–111)
Creatinine, Ser: 1.07 mg/dL — ABNORMAL HIGH (ref 0.44–1.00)
GFR, Estimated: 56 mL/min — ABNORMAL LOW (ref >60)
Glucose, Bld: 99 mg/dL (ref 70–99)
Potassium: 3.7 mmol/L (ref 3.5–5.1)
Sodium: 134 mmol/L — ABNORMAL LOW (ref 135–145)

## 2021-09-09 LAB — CBC
HCT: 40.2 % (ref 36.0–46.0)
Hemoglobin: 13.5 g/dL (ref 12.0–15.0)
MCH: 33.1 pg (ref 26.0–34.0)
MCHC: 33.6 g/dL (ref 30.0–36.0)
MCV: 98.5 fL (ref 80.0–100.0)
Platelets: 217 10*3/uL (ref 150–400)
RBC: 4.08 MIL/uL (ref 3.87–5.11)
RDW: 12.3 % (ref 11.5–15.5)
WBC: 9.5 10*3/uL (ref 4.0–10.5)
nRBC: 0 % (ref 0.0–0.2)

## 2021-09-09 LAB — CEA: CEA: 1.9 ng/mL (ref 0.0–4.7)

## 2021-09-09 SURGERY — COLECTOMY, WITH COLOSTOMY CREATION
Anesthesia: General | Site: Abdomen

## 2021-09-09 MED ORDER — ROCURONIUM BROMIDE 100 MG/10ML IV SOLN
INTRAVENOUS | Status: DC | PRN
  Administered 2021-09-09: 50 mg via INTRAVENOUS
  Administered 2021-09-09: 20 mg via INTRAVENOUS
  Administered 2021-09-09: 10 mg via INTRAVENOUS
  Administered 2021-09-09: 5 mg via INTRAVENOUS

## 2021-09-09 MED ORDER — MORPHINE SULFATE (PF) 2 MG/ML IV SOLN
2.0000 mg | INTRAVENOUS | Status: DC | PRN
  Administered 2021-09-09 – 2021-09-12 (×13): 2 mg via INTRAVENOUS
  Filled 2021-09-09 (×14): qty 1

## 2021-09-09 MED ORDER — ENOXAPARIN SODIUM 60 MG/0.6ML IJ SOSY
0.5000 mg/kg | PREFILLED_SYRINGE | INTRAMUSCULAR | Status: DC
  Administered 2021-09-10 – 2021-09-14 (×5): 50 mg via SUBCUTANEOUS
  Filled 2021-09-09 (×5): qty 0.6

## 2021-09-09 MED ORDER — ACETAMINOPHEN 10 MG/ML IV SOLN
INTRAVENOUS | Status: DC | PRN
  Administered 2021-09-09: 1000 mg via INTRAVENOUS

## 2021-09-09 MED ORDER — FENTANYL CITRATE (PF) 100 MCG/2ML IJ SOLN
INTRAMUSCULAR | Status: AC
  Filled 2021-09-09: qty 2

## 2021-09-09 MED ORDER — ONDANSETRON HCL 4 MG/2ML IJ SOLN
INTRAMUSCULAR | Status: AC
  Filled 2021-09-09: qty 2

## 2021-09-09 MED ORDER — SUCCINYLCHOLINE CHLORIDE 200 MG/10ML IV SOSY
PREFILLED_SYRINGE | INTRAVENOUS | Status: AC
  Filled 2021-09-09: qty 10

## 2021-09-09 MED ORDER — FENTANYL CITRATE (PF) 100 MCG/2ML IJ SOLN
INTRAMUSCULAR | Status: AC
  Administered 2021-09-09: 50 ug via INTRAVENOUS
  Filled 2021-09-09: qty 2

## 2021-09-09 MED ORDER — ROCURONIUM BROMIDE 10 MG/ML (PF) SYRINGE
PREFILLED_SYRINGE | INTRAVENOUS | Status: AC
  Filled 2021-09-09: qty 10

## 2021-09-09 MED ORDER — LIDOCAINE HCL (PF) 2 % IJ SOLN
INTRAMUSCULAR | Status: AC
  Filled 2021-09-09: qty 5

## 2021-09-09 MED ORDER — DEXAMETHASONE SODIUM PHOSPHATE 10 MG/ML IJ SOLN
INTRAMUSCULAR | Status: AC
  Filled 2021-09-09: qty 1

## 2021-09-09 MED ORDER — HYDROMORPHONE HCL 1 MG/ML IJ SOLN
INTRAMUSCULAR | Status: DC | PRN
  Administered 2021-09-09: .5 mg via INTRAVENOUS

## 2021-09-09 MED ORDER — SODIUM CHLORIDE 0.9 % IV SOLN: INTRAVENOUS | Status: DC

## 2021-09-09 MED ORDER — FENTANYL CITRATE (PF) 100 MCG/2ML IJ SOLN
INTRAMUSCULAR | Status: DC | PRN
  Administered 2021-09-09 (×2): 50 ug via INTRAVENOUS

## 2021-09-09 MED ORDER — SUCCINYLCHOLINE CHLORIDE 200 MG/10ML IV SOSY
PREFILLED_SYRINGE | INTRAVENOUS | Status: DC | PRN
  Administered 2021-09-09: 120 mg via INTRAVENOUS

## 2021-09-09 MED ORDER — BUPIVACAINE-EPINEPHRINE (PF) 0.25% -1:200000 IJ SOLN
INTRAMUSCULAR | Status: AC
  Filled 2021-09-09: qty 30

## 2021-09-09 MED ORDER — SUGAMMADEX SODIUM 200 MG/2ML IV SOLN
INTRAVENOUS | Status: DC | PRN
  Administered 2021-09-09: 200 mg via INTRAVENOUS

## 2021-09-09 MED ORDER — PROPOFOL 10 MG/ML IV BOLUS
INTRAVENOUS | Status: DC | PRN
  Administered 2021-09-09: 160 mg via INTRAVENOUS

## 2021-09-09 MED ORDER — 0.9 % SODIUM CHLORIDE (POUR BTL) OPTIME
TOPICAL | Status: DC | PRN
  Administered 2021-09-09 (×3): 500 mL
  Administered 2021-09-09: 1000 mL
  Administered 2021-09-09: 500 mL
  Administered 2021-09-09: 1000 mL

## 2021-09-09 MED ORDER — OXYCODONE HCL 5 MG PO TABS: 5.0000 mg | ORAL_TABLET | Freq: Once | ORAL | Status: DC | PRN

## 2021-09-09 MED ORDER — ACETAMINOPHEN 10 MG/ML IV SOLN
INTRAVENOUS | Status: AC
  Filled 2021-09-09: qty 100

## 2021-09-09 MED ORDER — FENTANYL CITRATE (PF) 100 MCG/2ML IJ SOLN
25.0000 ug | INTRAMUSCULAR | Status: DC | PRN
  Administered 2021-09-09 (×2): 50 ug via INTRAVENOUS

## 2021-09-09 MED ORDER — OXYCODONE HCL 5 MG/5ML PO SOLN: 5.0000 mg | Freq: Once | ORAL | Status: DC | PRN

## 2021-09-09 MED ORDER — PHENYLEPHRINE HCL (PRESSORS) 10 MG/ML IV SOLN
INTRAVENOUS | Status: DC | PRN
  Administered 2021-09-09 (×2): 100 ug via INTRAVENOUS

## 2021-09-09 MED ORDER — GLYCOPYRROLATE 0.2 MG/ML IJ SOLN
INTRAMUSCULAR | Status: AC
  Filled 2021-09-09: qty 1

## 2021-09-09 MED ORDER — LACTATED RINGERS IV SOLN
INTRAVENOUS | Status: DC | PRN
Start: 2021-09-09 — End: 2021-09-09

## 2021-09-09 MED ORDER — DEXAMETHASONE SODIUM PHOSPHATE 10 MG/ML IJ SOLN
INTRAMUSCULAR | Status: DC | PRN
Start: 2021-09-09 — End: 2021-09-09
  Administered 2021-09-09: 5 mg via INTRAVENOUS

## 2021-09-09 MED ORDER — ENOXAPARIN SODIUM 40 MG/0.4ML IJ SOSY: 40.0000 mg | PREFILLED_SYRINGE | INTRAMUSCULAR | Status: DC

## 2021-09-09 MED ORDER — HYDROMORPHONE HCL 1 MG/ML IJ SOLN
INTRAMUSCULAR | Status: AC
  Filled 2021-09-09: qty 1

## 2021-09-09 MED ORDER — LIDOCAINE HCL (CARDIAC) PF 100 MG/5ML IV SOSY
PREFILLED_SYRINGE | INTRAVENOUS | Status: DC | PRN
  Administered 2021-09-09: 100 mg via INTRAVENOUS

## 2021-09-09 SURGICAL SUPPLY — 51 items
APL PRP STRL LF DISP 70% ISPRP (MISCELLANEOUS) ×1
BLADE CLIPPER SURG (BLADE) IMPLANT
CANISTER WOUND CARE 500ML ATS (WOUND CARE) ×1 IMPLANT
CHLORAPREP W/TINT 26 (MISCELLANEOUS) ×2 IMPLANT
CLIP VESOCCLUDE LG 6/CT (CLIP) IMPLANT
CLIP VESOCCLUDE MED 6/CT (CLIP) IMPLANT
DRAPE INCISE IOBAN 66X45 STRL (DRAPES) ×2 IMPLANT
DRSG OPSITE POSTOP 4X10 (GAUZE/BANDAGES/DRESSINGS) IMPLANT
DRSG OPSITE POSTOP 4X8 (GAUZE/BANDAGES/DRESSINGS) IMPLANT
ELECT BLADE 6.5 EXT (BLADE) ×1 IMPLANT
ELECT CAUTERY BLADE TIP 2.5 (TIP) ×2
ELECT EZSTD 165MM 6.5IN (MISCELLANEOUS) ×2
ELECT REM PT RETURN 9FT ADLT (ELECTROSURGICAL) ×2
ELECTRODE CAUTERY BLDE TIP 2.5 (TIP) ×1 IMPLANT
ELECTRODE EZSTD 165MM 6.5IN (MISCELLANEOUS) ×1 IMPLANT
ELECTRODE REM PT RTRN 9FT ADLT (ELECTROSURGICAL) ×1 IMPLANT
GAUZE 4X4 16PLY ~~LOC~~+RFID DBL (SPONGE) ×2 IMPLANT
GAUZE PACKING 1/4 X5 YD (GAUZE/BANDAGES/DRESSINGS) ×1 IMPLANT
GLOVE SURG ORTHO LTX SZ7.5 (GLOVE) ×8 IMPLANT
GOWN STRL REUS W/ TWL LRG LVL3 (GOWN DISPOSABLE) ×6 IMPLANT
GOWN STRL REUS W/TWL LRG LVL3 (GOWN DISPOSABLE) ×16
KIT OSTOMY DRAINABLE 3.25 STR (WOUND CARE) ×1 IMPLANT
KIT TURNOVER KIT A (KITS) ×2 IMPLANT
LIGASURE IMPACT 36 18CM CVD LR (INSTRUMENTS) ×1 IMPLANT
MANIFOLD NEPTUNE II (INSTRUMENTS) ×2 IMPLANT
NS IRRIG 1000ML POUR BTL (IV SOLUTION) ×2 IMPLANT
PACK BASIN MAJOR ARMC (MISCELLANEOUS) ×2 IMPLANT
PACK COLON CLEAN CLOSURE (MISCELLANEOUS) ×1 IMPLANT
PREVENA INCISION MGT 90 150 (MISCELLANEOUS) ×1 IMPLANT
RELOAD PROXIMATE 75MM BLUE (ENDOMECHANICALS) ×2 IMPLANT
RELOAD STAPLE 75 3.8 BLU REG (ENDOMECHANICALS) IMPLANT
RELOAD STAPLER LINEAR PROX 30 (STAPLE) IMPLANT
SEALER TISSUE X1 CVD JAW (INSTRUMENTS) ×1 IMPLANT
SPONGE T-LAP 18X18 ~~LOC~~+RFID (SPONGE) ×8 IMPLANT
STAPLER GUN LINEAR PROX 60 (STAPLE) ×1 IMPLANT
STAPLER PROXIMATE 75MM BLUE (STAPLE) ×2 IMPLANT
STAPLER RELOAD LINEAR PROX 30 (STAPLE)
STAPLER RELOADABLE 30 BLU REG (STAPLE) ×1 IMPLANT
STAPLER SKIN PROX 35W (STAPLE) ×1 IMPLANT
SUT CHROMIC 2 0 SH (SUTURE) IMPLANT
SUT PDS AB 0 CT1 27 (SUTURE) ×4 IMPLANT
SUT PROLENE 2 0 FS (SUTURE) ×1 IMPLANT
SUT PROLENE 2 0 SH DA (SUTURE) ×1 IMPLANT
SUT SILK 2 0 (SUTURE) ×2
SUT SILK 2-0 30XBRD TIE 12 (SUTURE) ×1 IMPLANT
SUT SILK 3-0 (SUTURE) ×2 IMPLANT
SUT VIC AB 3-0 SH 27 (SUTURE) ×6
SUT VIC AB 3-0 SH 27X BRD (SUTURE) ×2 IMPLANT
SYR BULB IRRIG 60ML STRL (SYRINGE) ×2 IMPLANT
TRAY FOLEY SLVR 16FR LF STAT (SET/KITS/TRAYS/PACK) ×2 IMPLANT
WATER STERILE IRR 500ML POUR (IV SOLUTION) ×1 IMPLANT

## 2021-09-09 NOTE — Transfer of Care (Signed)
Immediate Anesthesia Transfer of Care Note  Patient: Abigail Mitchell  Procedure(s) Performed: COLECTOMY WITH COLOSTOMY CREATION/HARTMANN PROCEDURE (Abdomen)  Patient Location: PACU  Anesthesia Type:General  Level of Consciousness: awake, alert  and oriented  Airway & Oxygen Therapy: Patient Spontanous Breathing and Patient connected to face mask oxygen  Post-op Assessment: Report given to RN and Post -op Vital signs reviewed and stable  Post vital signs: Reviewed and stable  Last Vitals:  Vitals Value Taken Time  BP 95/68 09/09/21 1138  Temp    Pulse 64 09/09/21 1143  Resp 16 09/09/21 1143  SpO2 100 % 09/09/21 1143  Vitals shown include unvalidated device data.  Last Pain:  Vitals:   09/09/21 0816  TempSrc: Oral  PainSc: 7       Patients Stated Pain Goal: 3 (74/25/95 6387)  Complications: No notable events documented.

## 2021-09-09 NOTE — Progress Notes (Signed)
PROGRESS NOTE    Abigail Mitchell  HFW:[id number] DOB: 02/04/1951 DOA: 09/07/2021 PCP: Crist Infante, MD  [phone]   Assessment & Plan:   Active Problems:   Acute diverticulitis   Abigail Mitchell is a 70 y.o. female with medical history significant for diverticulitis, dyslipidemia and hypothyroidism, who presented to emergency room with acute onset of lower abdominal pain worse in the left lower quadrant with associated vomiting and bloating.   1.  Acute diverticulitis with associated colitis. --cont IV zosyn for now  Large bowel obstruction --concerning for obstructing mass Plan: --colectomy with colostomy creation today --MIVF@49  while NPO  3.  Dyslipidemia. --resume statin after discharge  4.  Hypothyroidism. --cont Synthroid   DVT prophylaxis: Lovenox SQ Code Status: Full code  Family Communication:  Level of care: Med-Surg Dispo:   The patient is from: home Anticipated d/c is to: home Anticipated d/c date is: >3 days Patient currently is not medically ready to d/c due to: just had bowel resection, need to wait for return of bowel function   Subjective and Interval History:  Pt went for [place] today.  Afterwards, pt had a lot of stool output from stoma.   Objective: Vitals:   09/09/21 1224 09/09/21 1230 09/09/21 1245 09/09/21 1556  BP:  (!) 114/58 (!) 102/51 (!) 108/51  Pulse: 75 75 70 71  Resp: 15 14 11 18   Temp:   (!) 97.5 F (36.4 C) 98.1 F (36.7 C)  TempSrc:      SpO2: 94% 96% 96% 95%  Weight:      Height:        Intake/Output Summary (Last 24 hours) at 09/09/2021 1815 Last data filed at 09/09/2021 1300 Gross per 24 hour  Intake 850 ml  Output 170 ml  Net 680 ml   Filed Weights   09/06/21 1921 09/09/21 0816  Weight: 98 kg 98 kg    Examination:   Constitutional: NAD, AAOx3 HEENT: conjunctivae and lids normal, EOMI CV: No cyanosis.   RESP: normal respiratory effort, on RA GI: central surgical dressing with  vacuum seal.  Abdominal drain to vacuum.  Stoma outputting pasty stool Extremities: No effusions, edema in BLE SKIN: warm, dry Neuro: II - XII grossly intact.   Psych: Normal mood and affect.  Appropriate judgement and reason   Data Reviewed: I have personally reviewed following labs and imaging studies  CBC: Recent Labs  Lab 09/06/21 1927 09/07/21 0535 09/08/21 0513 09/09/21 0431  WBC 12.1* 12.2* 8.9 9.5  HGB 15.8* 14.0 13.5 13.5  HCT 44.1 41.0 38.3 40.2  MCV 97.4 100.0 99.7 98.5  PLT 256 220 203 676   Basic Metabolic Panel: Recent Labs  Lab 09/06/21 1927 09/07/21 0535 09/08/21 0513 09/09/21 0431  NA 137 138 132* 134*  K 3.7 4.2 3.6 3.7  CL 105 108 103 101  CO2 20* 25 25 24   GLUCOSE 128* 109* 110* 99  BUN 18 18 12 13   CREATININE 1.26* 1.15* 1.06* 1.07*  CALCIUM 8.7* 7.9* 7.9* 8.1*  MG  --   --  2.5* 2.5*   GFR: Estimated Creatinine Clearance: 57.8 mL/min (A) (by C-G formula based on SCr of 1.07 mg/dL (H)). Liver Function Tests: Recent Labs  Lab 09/06/21 1927  AST 23  ALT 19  ALKPHOS 62  BILITOT 0.8  PROT 7.6  ALBUMIN 4.1   Recent Labs  Lab 09/06/21 1927  LIPASE 32   No results for input(s): AMMONIA in the last 168 hours. Coagulation Profile:  No results for input(s): INR, PROTIME in the last 168 hours. Cardiac Enzymes: No results for input(s): CKTOTAL, CKMB, CKMBINDEX, TROPONINI in the last 168 hours. BNP (last 3 results) No results for input(s): PROBNP in the last 8760 hours. HbA1C: No results for input(s): HGBA1C in the last 72 hours. CBG: Recent Labs  Lab 09/07/21 1620  GLUCAP 121*   Lipid Profile: No results for input(s): CHOL, HDL, LDLCALC, TRIG, CHOLHDL, LDLDIRECT in the last 72 hours. Thyroid Function Tests: No results for input(s): TSH, T4TOTAL, FREET4, T3FREE, THYROIDAB in the last 72 hours. Anemia Panel: No results for input(s): VITAMINB12, FOLATE, FERRITIN, TIBC, IRON, RETICCTPCT in the last 72 hours. Sepsis Labs: Recent Labs   Lab 09/07/21 [place] <0.10    Recent Results (from the past 240 hour(s))  Resp Panel by RT-PCR (Flu A&B, Covid) Nasopharyngeal Swab     Status: None   Collection Time: 09/07/21 12:52 AM   Specimen: Nasopharyngeal Swab; Nasopharyngeal(NP) swabs in vial transport medium  Result Value Ref Range Status   SARS Coronavirus 2 by RT PCR NEGATIVE NEGATIVE Final    Comment: (NOTE) SARS-CoV-2 target nucleic acids are NOT DETECTED.  The SARS-CoV-2 RNA is generally detectable in upper respiratory specimens during the acute phase of infection. The lowest concentration of SARS-CoV-2 viral copies this assay can detect is 138 copies/mL. A negative result does not preclude SARS-Cov-2 infection and should not be used as the sole basis for treatment or other patient management decisions. A negative result may occur with  improper specimen collection/handling, submission of specimen other than nasopharyngeal swab, presence of viral mutation(s) within the areas targeted by this assay, and inadequate number of viral copies(<138 copies/mL). A negative result must be combined with clinical observations, patient history, and epidemiological information. The expected result is Negative.  Fact Sheet for Patients:  EntrepreneurPulse.com.au  Fact Sheet for Healthcare Providers:  IncredibleEmployment.be  This test is no t yet approved or cleared by the [place] FDA and  has been authorized for detection and/or diagnosis of SARS-CoV-2 by FDA under an Emergency Use Authorization (EUA). This EUA will remain  in effect (meaning this test can be used) for the duration of the COVID-19 declaration under Section 564(b)(1) of the Act, 21 U.S.C.section 360bbb-3(b)(1), unless the authorization is terminated  or revoked sooner.       Influenza A by PCR NEGATIVE NEGATIVE Final   Influenza B by PCR NEGATIVE NEGATIVE Final    Comment: (NOTE) The Xpert Xpress  SARS-CoV-2/FLU/RSV plus assay is intended as an aid in the diagnosis of influenza from Nasopharyngeal swab specimens and should not be used as a sole basis for treatment. Nasal washings and aspirates are unacceptable for Xpert Xpress SARS-CoV-2/FLU/RSV testing.  Fact Sheet for Patients: EntrepreneurPulse.com.au  Fact Sheet for Healthcare Providers: IncredibleEmployment.be  This test is not yet approved or cleared by the [place] FDA and has been authorized for detection and/or diagnosis of SARS-CoV-2 by FDA under an Emergency Use Authorization (EUA). This EUA will remain in effect (meaning this test can be used) for the duration of the COVID-19 declaration under Section 564(b)(1) of the Act, 21 U.S.C. section 360bbb-3(b)(1), unless the authorization is terminated or revoked.  Performed at [facility], [address] [place], [place] [address]       Radiology Studies: No results found.   Scheduled Meds:  enoxaparin (LOVENOX) injection  40 mg Subcutaneous Q24H   levothyroxine  50 mcg Oral Q0600   montelukast  10 mg Oral QHS  Continuous Infusions:  sodium chloride     piperacillin-tazobactam 3.375 g (09/09/21 1742)     LOS: 2 days     Enzo Bi, MD [facility] If 7PM-7AM, please contact night-coverage 09/09/2021, 6:15 PM

## 2021-09-09 NOTE — Op Note (Signed)
SURGICAL OPERATIVE REPORT  DATE OF PROCEDURE: 09/09/2021  ATTENDING: Surgeon(s): Ronny Bacon, MD  ASSISTANT(S): Otho Ket, PA-C (a first assis was necessary due to the technical complexity of the case as well as the need for adequate exposure.  Specifically, Mr. Olean Ree assisted with hemostasis on divided vessels as well as dividing tissues that were exposed as I performed the dissection.  Also essential to assist with retraction of the abdominal wall/and markedly dilated bowels.)  ANESTHESIA: GETA  PRE-OPERATIVE DIAGNOSIS: Obstruction of the sigmoid colon  POST-OPERATIVE DIAGNOSIS: Obstructing diverticulitis (K57.32/20) of the sigmoid colon   PROCEDURE(S): Hartmann's procedure 44143  Partial colectomy with creation of end colostomy  INTRAOPERATIVE FINDINGS: Markedly distended colon beginning at point of obstruction of mid sigmoid colon, throughout cecum.  Specimen opened at completion of procedure, no neoplastic appearing changes of the mucosa identified.  INTRAVENOUS FLUIDS: See anesthesia record  ESTIMATED BLOOD LOSS: 100 mL   URINE OUTPUT: See anesthesia record  SPECIMENS: Partial Sigmoid colon, suture marks the rectal aspect of the specimen.  IMPLANTS: None  DRAINS: None   COMPLICATIONS: None apparent   CONDITION AT END OF PROCEDURE: Hemodynamically stable and extubated   DISPOSITION OF PATIENT: PACU  INDICATIONS FOR PROCEDURE:   Patient is a 70 y.o. female who presented with large bowel obstruction, confirmed to a degree of 5 mm on barium enema. All risks, benefits, and alternatives to above procedure were discussed with the patient, all of patient's questions were answered to his/her expressed satisfaction, and informed consent was obtained and documented.  DETAILS OF PROCEDURE: Patient was brought to the operating suite and appropriately identified. General anesthesia was administered along with ensuring adequate antibiotics coverage, and endotracheal  intubation was performed by anesthetist. In supine position, operative site was prepped and draped in the usual sterile fashion, and following a brief time out, a lower vertical midline incision was made from the Right side of the umbilicus inferiorly to just above the pubis using a #10 blade scalpel and extended deep through subcutaneous tissues until fascia was visualized and exposed along the linea alba midline between the rectus abdominal muscles. Lower midline fascia was then divided in the midline superiorly to the Right of the umbilicus in case creation of a colostomy. Upon dissecting through preperitoneal fat, the peritoneum was entered and opened along the length of the incision, taking care to avoid injury to underlying dilated bowel.  Large, firm, and irregular mass was palpated in the sigmoid colon. Abdominal cavity was explored, and the liver was palpated with neither visible nor palpable evidence of hepatic, omental, or peritoneal metastases.  Due to the marked distention of the entire colon, it was felt prudent to proceed with decompression to continue the operation.  Pursestring sutures of 2-0 silk were applied to the markedly distended descending colon.  We walled off the surrounding area with multiple towels.  A colotomy was made with electrosurgery, and with some degree of fecal spillage a pool sucker was placed within the lumen and secured.  We were then able to decompress the descending colon and transverse colon to allow [place] to proceed with surgery.  A second pursestring was applied to the site and the colotomy was closed. Those contaminated towels were then removed from the operative field along with the suction tip and the instruments. The small bowel was retracted to the superiorly and to the Right, enabling placement of  self-retaining retractor. The descending colon was then retracted medially using a moist towel. Using a combination of sharp dissection and  electrocautery, the colon  was freed from its lateral peritoneal attachments along the white line of [place] distally approaching the densely adherent sigmoid to the left lower quadrant.  With pinch dissection, I was able to mobilize this aspect as these adhesions appeared to be more chronic inflammatory rather than neoplastic in nature.  We then continued mobilization of the sigmoid colon through the rectosigmoid junction and proximally toward the splenic flexure. During this dissection, injury to the ureters was avoided. The splenic flexure did not need to be taken down in this case. Points of transection were selected distally and proximally, and the proximal bowel was stapled and divided using a GIA-75 linear cutting stapler.  Peritoneum was then scored along the mesentary with electrocautery, and the vessels were sealed, using the Ethicon super jaw.  This maintained excellent hemostasis and allowed [place] to approach the distal resection margin. The distal colon was stapled and divided using the same 75 mm length GIA linear cutting stapler.  This may have created a tapering of the residual rectal stump.   Colon specimen was then marked distally with a silk tie and was handed off of the field as specimen for pathology.   The rectal stump staple line was intact, 2-0 Prolene sutures were applied to each corner anterior and posterior as the taper extends from posterior to anterior due to the angulation of the GIA stapler for later identification.  The distal descending colon was then mobilized medially from its left sided attachments. Once I had adequate laxity to reach the anterior abdominal wall for its colostomy creation, I then proceeded with preparation for closure.  A circle of skin was excised at the proposed colostomy site, subcutaneous tissues were then mobilized with electrosurgery.  The rectus sheath was then incised and a cruciate manner and dilated passage to the abdominal cavity up to 3 fingers breadth. Within withdrew  the distal descending colon through the opening created.  And proceeded with irrigation of the abdominal cavity with multiple aliquots of warm normal saline solution.  Confirmed adequate hemostasis.  We then closed the midline fascia with a running 0 PDS.  We then irrigated subcutaneous tissues, confirmed adequate hemostasis and closed the skin loosely with staples.  Skin was then cleaned and dried, quarter inch packing strip was placed at intervals to drain the subcutaneous tissues, and a sterile Prevena negative pressure wound therapy dressing was applied.  The stoma was then trimmed of excess adipose tissue, the staple line removed sharply.  Hemostasis obtained.  Stoma was then matured with interrupted 3-0 Vicryl sutures and appliance applied and secured.    Patient was then safely able to be extubated, awakened, and transferred to PACU for post-operative monitoring and care.  I was present for all aspects of the above procedure, and no operative complications were apparent.

## 2021-09-09 NOTE — Progress Notes (Signed)
PHARMACIST - PHYSICIAN COMMUNICATION  CONCERNING:  Enoxaparin (Lovenox) for DVT Prophylaxis    RECOMMENDATION: Patient was prescribed enoxaprin 40mg  q24 hours for VTE prophylaxis.   Filed Weights   09/06/21 1921 09/09/21 0816  Weight: 98 kg (216 lb) 98 kg (216 lb)    Body mass index is 34.86 kg/m.  Estimated Creatinine Clearance: 57.8 mL/min (A) (by C-G formula based on SCr of 1.07 mg/dL (H)).   Based on [place] patient is candidate for enoxaparin 0.5mg /kg TBW SQ every 24 hours based on BMI being >30.   DESCRIPTION: Pharmacy has adjusted enoxaparin dose per [facility] policy.  Patient is now receiving enoxaparin 50 mg every 24 hours    Sherilyn Banker, PharmD Clinical Pharmacist  09/09/2021 6:21 PM

## 2021-09-09 NOTE — Anesthesia Postprocedure Evaluation (Signed)
Anesthesia Post Note  Patient: Abigail Mitchell  Procedure(s) Performed: COLECTOMY WITH COLOSTOMY CREATION/HARTMANN PROCEDURE (Abdomen)  Patient location during evaluation: PACU Anesthesia Type: General Level of consciousness: awake and alert Pain management: pain level controlled Vital Signs Assessment: post-procedure vital signs reviewed and stable Respiratory status: spontaneous breathing, nonlabored ventilation, respiratory function stable and patient connected to nasal cannula oxygen Cardiovascular status: blood pressure returned to baseline and stable Postop Assessment: no apparent nausea or vomiting Anesthetic complications: no   No notable events documented.   Last Vitals:  Vitals:   09/09/21 1230 09/09/21 1245  BP: (!) 114/58 (!) 102/51  Pulse: 75 70  Resp: 14 11  Temp:  (!) 36.4 C  SpO2: 96% 96%    Last Pain:  Vitals:   09/09/21 1245  TempSrc:   PainSc: 2                  Precious Haws Eyla Tallon

## 2021-09-09 NOTE — Anesthesia Procedure Notes (Signed)
Procedure Name: Intubation Date/Time: 09/09/2021 9:11 AM Performed by: Zetta Bills, CRNA Pre-anesthesia Checklist: Patient identified, Emergency Drugs available, Suction available, Patient being monitored and Timeout performed Patient Re-evaluated:Patient Re-evaluated prior to induction Oxygen Delivery Method: Circle system utilized Preoxygenation: Pre-oxygenation with 100% oxygen Induction Type: IV induction Ventilation: Mask ventilation without difficulty Laryngoscope Size: McGraph and 3 Grade View: Grade I Tube type: Oral Tube size: 7.0 mm Number of attempts: 1 Airway Equipment and Method: Stylet Placement Confirmation: ETT inserted through vocal cords under direct vision and positive ETCO2 Secured at: 21 cm Tube secured with: Tape Dental Injury: Teeth and Oropharynx as per pre-operative assessment

## 2021-09-09 NOTE — Care Management Important Message (Signed)
Important Message  Patient Details  Name: Abigail Mitchell MRN: [id number] Date of Birth: Apr 24, 1951   Medicare Important Message Given:  Yes     Dannette Barbara 09/09/2021, 12:44 PM

## 2021-09-09 NOTE — Progress Notes (Signed)
[place] Hospital Day(s): 2.   Interval History:  Patient seen and examined No acute events or new complaints overnight.  Patient reports she continues to have abdominal pain and distension, unchanged No fever, chills, emesis She remains without leukocytosis; WBC 9.5K Renal function improved but slightly elevated from normal; sCr - 1.07; UO - unmeasured Mild hyponatremia to 134 o/w no significant electrolyte derangements She is NPO this morning Plan for colectomy and likely colostomy today   Vital signs in last 24 hours: [min-max] current  Temp:  [97.6 F (36.4 C)-98.9 F (37.2 C)] 98.9 F (37.2 C) (10/14 0414) Pulse Rate:  [68-71] 71 (10/14 0414) Resp:  [16-20] 19 (10/14 0414) BP: (107-131)/(56-63) 128/60 (10/14 0414) SpO2:  [96 %-99 %] 96 % (10/14 0414)     Height: 5\' 6"  (167.6 cm) Weight: 98 kg BMI (Calculated): 34.88   Intake/Output last 2 shifts:  No intake/output data recorded.   Physical Exam:  Constitutional: alert, cooperative and no distress  HENT: normocephalic without obvious abnormality  Eyes: PERRL, EOM's grossly intact and symmetric  Respiratory: breathing non-labored at rest  Cardiovascular: regular rate and sinus rhythm  Gastrointestinal: Abdomen is firm, markedly distended and tympanic, she is again interestingly more tender on the right, no rebound/guarding Musculoskeletal: no edema or wounds, motor and sensation grossly intact, NT   Labs:  CBC Latest Ref Rng & Units 09/09/2021 09/08/2021 09/07/2021  WBC 4.0 - 10.5 K/uL 9.5 8.9 12.2(H)  Hemoglobin 12.0 - 15.0 g/dL 13.5 13.5 14.0  Hematocrit 36.0 - 46.0 % 40.2 38.3 41.0  Platelets 150 - 400 K/uL 217 203 220   CMP Latest Ref Rng & Units 09/09/2021 09/08/2021 09/07/2021  Glucose 70 - 99 mg/dL 99 110(H) 109(H)  BUN 8 - 23 mg/dL 13 12 18   Creatinine 0.44 - 1.00 mg/dL 1.07(H) 1.06(H) 1.15(H)  Sodium 135 - 145 mmol/L 134(L) 132(L) 138  Potassium 3.5 - 5.1 mmol/L  3.7 3.6 4.2  Chloride 98 - 111 mmol/L 101 103 108  CO2 22 - 32 mmol/L 24 25 25   Calcium 8.9 - 10.3 mg/dL 8.1(L) 7.9(L) 7.9(L)  Total Protein 6.5 - 8.1 g/dL - - -  Total Bilirubin 0.3 - 1.2 mg/dL - - -  Alkaline Phos 38 - 126 U/L - - -  AST 15 - 41 U/L - - -  ALT 0 - 44 U/L - - -     Imaging studies: No new pertinent imaging studies   Assessment/Plan: (ICD-10's: K40.609) 70 y.o. female with abdominal pain and distension found to have a large bowel obstruction likely secondary to obstructing mass rather than diverticulitis as initially thought.                - Plan for exploratory laparotomy and colectomy with end colostomy (Hartmann's Procedure) with Dr Christian Mate today - All risks, benefits, and alternatives to above procedure(s) were discussed with the patient, all of her questions were answered to her expressed satisfaction, patient expresses she wishes to proceed, and informed consent was obtained.               - NPO + IVF resuscitation - Continue IV Abx (Zosyn) - Monitor abdominal examination; on-going bowel function - Pain control prn; antiemetics prn             - DVT prophylaxis; hold for surgery              - Further management per primary service; we will of course follow  All of the above findings and recommendations were discussed with the patient, and all of patient's questions were answered to her expressed satisfaction.  -- Edison Simon, PA-C [facility] 09/09/2021, 7:28 AM [phone] M-F: 7am - 4pm

## 2021-09-09 NOTE — Anesthesia Preprocedure Evaluation (Signed)
Anesthesia Evaluation  Patient identified by MRN, date of birth, ID band Patient awake    Reviewed: Allergy & Precautions, NPO status , Patient's Chart, lab work & pertinent test results  History of Anesthesia Complications Negative for: history of anesthetic complications  Airway Mallampati: III  TM Distance: <3 FB Neck ROM: full    Dental  (+) Chipped   Pulmonary neg pulmonary ROS, neg shortness of breath,    Pulmonary exam normal        Cardiovascular Exercise Tolerance: Good (-) angina(-) Past MI negative cardio ROS Normal cardiovascular exam     Neuro/Psych negative neurological ROS  negative psych ROS   GI/Hepatic negative GI ROS, Neg liver ROS, neg GERD  ,  Endo/Other  negative endocrine ROS  Renal/GU      Musculoskeletal   Abdominal   Peds  Hematology negative hematology ROS (+)   Anesthesia Other Findings Past Medical History: No date: Diverticulosis 10/12/2006: Hx of colonic polyps - diminutive adenomas No date: Insomnia  Past Surgical History: No date: ABDOMINAL HYSTERECTOMY No date: BREAST SURGERY     Comment:  calcification No date: CESAREAN SECTION No date: COLONOSCOPY No date: TONSILLECTOMY No date: TUBAL LIGATION  BMI    Body Mass Index: 34.86 kg/m      Reproductive/Obstetrics negative OB ROS                             Anesthesia Physical Anesthesia Plan  ASA: 3  Anesthesia Plan: General ETT and Rapid Sequence   Post-op Pain Management:    Induction: Intravenous  PONV Risk Score and Plan: Ondansetron, Dexamethasone, Midazolam and Treatment may vary due to age or medical condition  Airway Management Planned: Oral ETT  Additional Equipment:   Intra-op Plan:   Post-operative Plan: Extubation in OR  Informed Consent: I have reviewed the patients History and Physical, chart, labs and discussed the procedure including the risks, benefits and  alternatives for the proposed anesthesia with the patient or authorized representative who has indicated his/her understanding and acceptance.     Dental Advisory Given  Plan Discussed with: Anesthesiologist, CRNA and Surgeon  Anesthesia Plan Comments: (Patient consented for risks of anesthesia including but not limited to:  - adverse reactions to medications - damage to eyes, teeth, lips or other oral mucosa - nerve damage due to positioning  - sore throat or hoarseness - Damage to heart, brain, nerves, lungs, other parts of body or loss of life  Patient voiced understanding.)        Anesthesia Quick Evaluation

## 2021-09-10 LAB — CBC
HCT: 39.3 % (ref 36.0–46.0)
Hemoglobin: 13.9 g/dL (ref 12.0–15.0)
MCH: 35.2 pg — ABNORMAL HIGH (ref 26.0–34.0)
MCHC: 35.4 g/dL (ref 30.0–36.0)
MCV: 99.5 fL (ref 80.0–100.0)
Platelets: 240 10*3/uL (ref 150–400)
RBC: 3.95 MIL/uL (ref 3.87–5.11)
RDW: 12.5 % (ref 11.5–15.5)
WBC: 13.9 10*3/uL — ABNORMAL HIGH (ref 4.0–10.5)
nRBC: 0 % (ref 0.0–0.2)

## 2021-09-10 LAB — BASIC METABOLIC PANEL
Anion gap: 7 (ref 5–15)
BUN: 22 mg/dL (ref 8–23)
CO2: 27 mmol/L (ref 22–32)
Calcium: 8 mg/dL — ABNORMAL LOW (ref 8.9–10.3)
Chloride: 99 mmol/L (ref 98–111)
Creatinine, Ser: 1.49 mg/dL — ABNORMAL HIGH (ref 0.44–1.00)
GFR, Estimated: 38 mL/min — ABNORMAL LOW (ref >60)
Glucose, Bld: 135 mg/dL — ABNORMAL HIGH (ref 70–99)
Potassium: 5 mmol/L (ref 3.5–5.1)
Sodium: 133 mmol/L — ABNORMAL LOW (ref 135–145)

## 2021-09-10 LAB — MAGNESIUM: Magnesium: 2.2 mg/dL (ref 1.7–2.4)

## 2021-09-10 MED ORDER — CHLORHEXIDINE GLUCONATE CLOTH 2 % EX PADS
6.0000 | MEDICATED_PAD | Freq: Every day | CUTANEOUS | Status: DC
  Administered 2021-09-10 – 2021-09-14 (×3): 6 via TOPICAL

## 2021-09-10 NOTE — Progress Notes (Signed)
PROGRESS NOTE    [name]  UYE:[id number] DOB: [birth year] DOA: 09/07/2021 PCP: Crist Infante, MD  [phone]   Assessment & Plan:   Active Problems:   Acute diverticulitis   Abigail Mitchell is a 70 y.o. female with medical history significant for diverticulitis, dyslipidemia and hypothyroidism, who presented to emergency room with acute onset of lower abdominal pain worse in the left lower quadrant with associated vomiting and bloating.   1.  Acute diverticulitis with associated colitis. --cont IV zosyn  Large bowel obstruction S/p colectomy with colostomy creation on 10/14 --concerning for obstructing mass, biopsy sent Plan: --clear liquid diet today --cont MIVF@50  for now  3.  Dyslipidemia. --resume statin after discharge  4.  Hypothyroidism. --cont synthroid   DVT prophylaxis: Lovenox SQ Code Status: Full code  Family Communication:  Level of care: Med-Surg Dispo:   The patient is from: home Anticipated d/c is to: home Anticipated d/c date is: >3 days Patient currently is not medically ready to d/c due to: just had bowel resection, need to wait for return of bowel function   Subjective and Interval History:  Abdominal pain and distention improved.  No N/V.  Tolerated liquid diet.     Objective: Vitals:   09/09/21 1245 09/09/21 1556 09/09/21 1926 09/10/21 0455  BP: (!) 102/51 (!) 108/51 (!) 102/56 (!) 99/50  Pulse: 70 71 78 88  Resp: 11 18 20 18   Temp: (!) 97.5 F (36.4 C) 98.1 F (36.7 C) (!) 97.3 F (36.3 C) 98.7 F (37.1 C)  TempSrc:   Oral Oral  SpO2: 96% 95% 95% 94%  Weight:      Height:        Intake/Output Summary (Last 24 hours) at 09/10/2021 1348 Last data filed at 09/10/2021 0539 Gross per 24 hour  Intake 444.14 ml  Output 235 ml  Net 209.14 ml   Filed Weights   09/06/21 1921 09/09/21 0816  Weight: 98 kg 98 kg    Examination:   Constitutional: NAD, AAOx3 HEENT: conjunctivae and lids normal, EOMI CV: No cyanosis.   RESP:  normal respiratory effort, on RA GI: some brown stool in stoma.  Sealed surgical dressing over mid abdomen. Extremities: No effusions, edema in BLE SKIN: warm, dry Neuro: II - XII grossly intact.   Psych: Normal mood and affect.  Appropriate judgement and reason   Data Reviewed: I have personally reviewed following labs and imaging studies  CBC: Recent Labs  Lab 09/06/21 1927 09/07/21 0535 09/08/21 0513 09/09/21 0431 09/10/21 0459  WBC 12.1* 12.2* 8.9 9.5 13.9*  HGB 15.8* 14.0 13.5 13.5 13.9  HCT 44.1 41.0 38.3 40.2 39.3  MCV 97.4 100.0 99.7 98.5 99.5  PLT 256 220 203 217 155   Basic Metabolic Panel: Recent Labs  Lab 09/06/21 1927 09/07/21 0535 09/08/21 0513 09/09/21 0431 09/10/21 0459  NA 137 138 132* 134* 133*  K 3.7 4.2 3.6 3.7 5.0  CL 105 108 103 101 99  CO2 20* 25 25 24 27   GLUCOSE 128* 109* 110* 99 135*  BUN 18 18 12 13 22   CREATININE 1.26* 1.15* 1.06* 1.07* 1.49*  CALCIUM 8.7* 7.9* 7.9* 8.1* 8.0*  MG  --   --  2.5* 2.5* 2.2   GFR: Estimated Creatinine Clearance: 41.5 mL/min (A) (by C-G formula based on SCr of 1.49 mg/dL (H)). Liver Function Tests: Recent Labs  Lab 09/06/21 1927  AST 23  ALT 19  ALKPHOS 62  BILITOT 0.8  PROT 7.6  ALBUMIN 4.1  Recent Labs  Lab 09/06/21 1927  LIPASE 32   No results for input(s): AMMONIA in the last 168 hours. Coagulation Profile: No results for input(s): INR, PROTIME in the last 168 hours. Cardiac Enzymes: No results for input(s): CKTOTAL, CKMB, CKMBINDEX, TROPONINI in the last 168 hours. BNP (last 3 results) No results for input(s): PROBNP in the last 8760 hours. HbA1C: No results for input(s): HGBA1C in the last 72 hours. CBG: Recent Labs  Lab 09/07/21 1620  GLUCAP 121*   Lipid Profile: No results for input(s): CHOL, HDL, LDLCALC, TRIG, CHOLHDL, LDLDIRECT in the last 72 hours. Thyroid Function Tests: No results for input(s): TSH, T4TOTAL, FREET4, T3FREE, THYROIDAB in the last 72 hours. Anemia  Panel: No results for input(s): VITAMINB12, FOLATE, FERRITIN, TIBC, IRON, RETICCTPCT in the last 72 hours. Sepsis Labs: Recent Labs  Lab 09/07/21 [place] <0.10    Recent Results (from the past 240 hour(s))  Resp Panel by RT-PCR (Flu A&B, Covid) Nasopharyngeal Swab     Status: None   Collection Time: 09/07/21 12:52 AM   Specimen: Nasopharyngeal Swab; Nasopharyngeal(NP) swabs in vial transport medium  Result Value Ref Range Status   SARS Coronavirus 2 by RT PCR NEGATIVE NEGATIVE Final    Comment: (NOTE) SARS-CoV-2 target nucleic acids are NOT DETECTED.  The SARS-CoV-2 RNA is generally detectable in upper respiratory specimens during the acute phase of infection. The lowest concentration of SARS-CoV-2 viral copies this assay can detect is 138 copies/mL. A negative result does not preclude SARS-Cov-2 infection and should not be used as the sole basis for treatment or other patient management decisions. A negative result may occur with  improper specimen collection/handling, submission of specimen other than nasopharyngeal swab, presence of viral mutation(s) within the areas targeted by this assay, and inadequate number of viral copies(<138 copies/mL). A negative result must be combined with clinical observations, patient history, and epidemiological information. The expected result is Negative.  Fact Sheet for Patients:  EntrepreneurPulse.com.au  Fact Sheet for Healthcare Providers:  IncredibleEmployment.be  This test is no t yet approved or cleared by the [place] FDA and  has been authorized for detection and/or diagnosis of SARS-CoV-2 by FDA under an Emergency Use Authorization (EUA). This EUA will remain  in effect (meaning this test can be used) for the duration of the COVID-19 declaration under Section 564(b)(1) of the Act, 21 U.S.C.section 360bbb-3(b)(1), unless the authorization is terminated  or revoked sooner.        Influenza A by PCR NEGATIVE NEGATIVE Final   Influenza B by PCR NEGATIVE NEGATIVE Final    Comment: (NOTE) The Xpert Xpress SARS-CoV-2/FLU/RSV plus assay is intended as an aid in the diagnosis of influenza from Nasopharyngeal swab specimens and should not be used as a sole basis for treatment. Nasal washings and aspirates are unacceptable for Xpert Xpress SARS-CoV-2/FLU/RSV testing.  Fact Sheet for Patients: EntrepreneurPulse.com.au  Fact Sheet for Healthcare Providers: IncredibleEmployment.be  This test is not yet approved or cleared by the [place] FDA and has been authorized for detection and/or diagnosis of SARS-CoV-2 by FDA under an Emergency Use Authorization (EUA). This EUA will remain in effect (meaning this test can be used) for the duration of the COVID-19 declaration under Section 564(b)(1) of the Act, 21 U.S.C. section 360bbb-3(b)(1), unless the authorization is terminated or revoked.  Performed at [facility], [address] [place], [place] [address]       Radiology Studies: No results found.   Scheduled Meds:  Chlorhexidine Gluconate  Cloth  6 each Topical Daily   enoxaparin (LOVENOX) injection  0.5 mg/kg Subcutaneous Q24H   levothyroxine  50 mcg Oral Q0600   montelukast  10 mg Oral QHS   Continuous Infusions:  sodium chloride 50 mL/hr at 09/10/21 0442   piperacillin-tazobactam 3.375 g (09/10/21 0840)     LOS: 3 days     Enzo Bi, MD [facility] If 7PM-7AM, please contact night-coverage 09/10/2021, 1:48 PM

## 2021-09-10 NOTE — Progress Notes (Signed)
[facility] Day(s): 3.   Post op day(s): 1 Day Post-Op.   Interval History: Patient seen and examined, no acute events or new complaints overnight. Patient reports not wearing a binder yet, wants to retain Foley for another day, primarily due to abdominal pain associated with mobilization.  Denies nausea or vomiting, initiating clear liquid diet.    Vital signs in last 24 hours: [min-max] current  Temp:  [97.3 F (36.3 C)-100.9 F (38.3 C)] 100.9 F (38.3 C) (10/15 1701) Pulse Rate:  [78-92] 92 (10/15 1701) Resp:  [18-20] 18 (10/15 1701) BP: (99-109)/(46-56) 109/46 (10/15 1701) SpO2:  [93 %-95 %] 93 % (10/15 1701)     Height: 5\' 6"  (167.6 cm) Weight: 98 kg BMI (Calculated): 34.88   Intake/Output last 2 shifts:  10/14 0701 - 10/15 0700 In: 1294.1 [I.V.:971.2; IV Piggyback:322.9] Out: 405 [Urine:295; Stool:10; Blood:100]   Physical Exam:  Constitutional: alert, cooperative and no distress  Respiratory: breathing non-labored at rest  Cardiovascular: regular rate and sinus rhythm  Gastrointestinal: Prevena VAC over incision intact.  Stomal appliance still attached.  Edematous large stoma clearly viable.  Otherwise soft, non-tender, and slightly improved distention   Labs:  CBC Latest Ref Rng & Units 09/10/2021 09/09/2021 09/08/2021  WBC 4.0 - 10.5 K/uL 13.9(H) 9.5 8.9  Hemoglobin 12.0 - 15.0 g/dL 13.9 13.5 13.5  Hematocrit 36.0 - 46.0 % 39.3 40.2 38.3  Platelets 150 - 400 K/uL 240 217 203   CMP Latest Ref Rng & Units 09/10/2021 09/09/2021 09/08/2021  Glucose 70 - 99 mg/dL 135(H) 99 110(H)  BUN 8 - 23 mg/dL 22 13 12   Creatinine 0.44 - 1.00 mg/dL 1.49(H) 1.07(H) 1.06(H)  Sodium 135 - 145 mmol/L 133(L) 134(L) 132(L)  Potassium 3.5 - 5.1 mmol/L 5.0 3.7 3.6  Chloride 98 - 111 mmol/L 99 101 103  CO2 22 - 32 mmol/L 27 24 25   Calcium 8.9 - 10.3 mg/dL 8.0(L) 8.1(L) 7.9(L)  Total Protein 6.5 - 8.1 g/dL - - -  Total Bilirubin 0.3 -  1.2 mg/dL - - -  Alkaline Phos 38 - 126 U/L - - -  AST 15 - 41 U/L - - -  ALT 0 - 44 U/L - - -     Imaging studies: No new pertinent imaging studies   Assessment/Plan:  70 y.o. female with  1 Day Post-Op s/p Hartman's procedure for what appears to be a benign sigmoid stricture, likely secondary to diverticulitis, complicated by pertinent comorbidities including obesity.  Patient Active Problem List   Diagnosis Date Noted   Acute diverticulitis 09/07/2021   Hx of colonic polyps - diminutive adenomas 10/12/2006    -Continue clear liquid diets cautiously, would not advance until further evidence of stomal output.  -Continue Foley for patient's request, an additional 24 hours, then will push more aggressive mobilization.  -Apply abdominal binder for improved mobility.  -Expect leukocytosis is reactive in nature, however patient remains on antibiotics, likely readily warranted for translocation/contamination from surgery.    -- Ronny Bacon, M.D., [facility] 09/10/2021

## 2021-09-11 DIAGNOSIS — K56609 Unspecified intestinal obstruction, unspecified as to partial versus complete obstruction: Secondary | ICD-10-CM

## 2021-09-11 LAB — BASIC METABOLIC PANEL
Anion gap: 10 (ref 5–15)
BUN: 20 mg/dL (ref 8–23)
CO2: 25 mmol/L (ref 22–32)
Calcium: 8.5 mg/dL — ABNORMAL LOW (ref 8.9–10.3)
Chloride: 99 mmol/L (ref 98–111)
Creatinine, Ser: 1.37 mg/dL — ABNORMAL HIGH (ref 0.44–1.00)
GFR, Estimated: 42 mL/min — ABNORMAL LOW (ref >60)
Glucose, Bld: 140 mg/dL — ABNORMAL HIGH (ref 70–99)
Potassium: 4.3 mmol/L (ref 3.5–5.1)
Sodium: 134 mmol/L — ABNORMAL LOW (ref 135–145)

## 2021-09-11 LAB — CBC
HCT: 37.2 % (ref 36.0–46.0)
Hemoglobin: 12.8 g/dL (ref 12.0–15.0)
MCH: 34.2 pg — ABNORMAL HIGH (ref 26.0–34.0)
MCHC: 34.4 g/dL (ref 30.0–36.0)
MCV: 99.5 fL (ref 80.0–100.0)
Platelets: 233 10*3/uL (ref 150–400)
RBC: 3.74 MIL/uL — ABNORMAL LOW (ref 3.87–5.11)
RDW: 12.6 % (ref 11.5–15.5)
WBC: 17.1 10*3/uL — ABNORMAL HIGH (ref 4.0–10.5)
nRBC: 0 % (ref 0.0–0.2)

## 2021-09-11 LAB — MAGNESIUM: Magnesium: 2.5 mg/dL — ABNORMAL HIGH (ref 1.7–2.4)

## 2021-09-11 MED ORDER — PANTOPRAZOLE SODIUM 40 MG IV SOLR: 40.0000 mg | Freq: Every day | INTRAVENOUS | Status: DC

## 2021-09-11 MED ORDER — PANTOPRAZOLE SODIUM 40 MG IV SOLR
40.0000 mg | Freq: Every day | INTRAVENOUS | Status: DC
  Administered 2021-09-11 – 2021-09-13 (×3): 40 mg via INTRAVENOUS
  Filled 2021-09-11 (×3): qty 40

## 2021-09-11 NOTE — Progress Notes (Signed)
PROGRESS NOTE    Abigail Mitchell  [id number] DOB: 1951/07/06 DOA: 09/07/2021 PCP: Crist Infante, MD  [phone]   Assessment & Plan:   Active Problems:   Acute diverticulitis   Abigail Mitchell is a 70 y.o. female with medical history significant for diverticulitis, dyslipidemia and hypothyroidism, who presented to emergency room with acute onset of lower abdominal pain worse in the left lower quadrant with associated vomiting and bloating.   1.  Acute diverticulitis with associated colitis. --started on IV zosyn on presentation. 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  Dyslipidemia. --resume statin after discharge  4.  Hypothyroidism. --cont Synthroid   DVT prophylaxis: Lovenox SQ Code Status: Full code  Family Communication:  Level of care: Med-Surg Dispo:   The patient is from: home Anticipated d/c is to: home Anticipated d/c date is: >3 days Patient currently is not medically ready to d/c due to: just had bowel resection, need to wait for return of bowel function   Subjective and Interval History:  Pt had mild fever overnight.    Pt complained of feeling worse today.  Was up in the chair.     Objective: Vitals:   09/10/21 1701 09/10/21 1957 09/10/21 2200 09/11/21 0759  BP: (!) 109/46 109/64  (!) 111/48  Pulse: 92 89  88  Resp: 18 15  12   Temp: (!) 100.9 F (38.3 C) (!) 100.6 F (38.1 C) 98.3 F (36.8 C) 99.1 F (37.3 C)  TempSrc: Oral Oral Oral Oral  SpO2: 93% 92%  93%  Weight:      Height:        Intake/Output Summary (Last 24 hours) at 09/11/2021 1619 Last data filed at 09/11/2021 1237 Gross per 24 hour  Intake 1999.14 ml  Output 570 ml  Net 1429.14 ml   Filed  Weights   09/06/21 1921 09/09/21 0816  Weight: 98 kg 98 kg    Examination:   Constitutional: NAD, AAOx3 HEENT: conjunctivae and lids normal, EOMI CV: No cyanosis.   RESP: normal respiratory effort, on RA GI: small amount of clear liquid in the ostomy bag.  Central vacuum sealed surgical dressing present. Extremities: No effusions, edema in BLE SKIN: warm, dry Neuro: II - XII grossly intact.   Psych: depressed mood and affect.  Appropriate judgement and reason   Data Reviewed: I have personally reviewed following labs and imaging studies  CBC: Recent Labs  Lab 09/07/21 0535 09/08/21 0513 09/09/21 0431 09/10/21 0459 09/11/21 0453  WBC 12.2* 8.9 9.5 13.9* 17.1*  HGB 14.0 13.5 13.5 13.9 12.8  HCT 41.0 38.3 40.2 39.3 37.2  MCV 100.0 99.7 98.5 99.5 99.5  PLT 220 203 217 240 158   Basic Metabolic Panel: Recent Labs  Lab 09/07/21 0535 09/08/21 0513 09/09/21 0431 09/10/21 0459 09/11/21 0453  NA 138 132* 134* 133* 134*  K 4.2 3.6 3.7 5.0 4.3  CL 108 103 101 99 99  CO2 25 25 24 27 25   GLUCOSE 109* 110* 99 135* 140*  BUN 18 12 13 22 20   CREATININE 1.15* 1.06* 1.07* 1.49* 1.37*  CALCIUM 7.9* 7.9* 8.1* 8.0* 8.5*  MG  --  2.5* 2.5* 2.2 2.5*   GFR: Estimated Creatinine Clearance: 45.1 mL/min (A) (by C-G formula based  on SCr of 1.37 mg/dL (H)). Liver Function Tests: Recent Labs  Lab 09/06/21 1927  AST 23  ALT 19  ALKPHOS 62  BILITOT 0.8  PROT 7.6  ALBUMIN 4.1   Recent Labs  Lab 09/06/21 1927  LIPASE 32   No results for input(s): AMMONIA in the last 168 hours. Coagulation Profile: No results for input(s): INR, PROTIME in the last 168 hours. Cardiac Enzymes: No results for input(s): CKTOTAL, CKMB, CKMBINDEX, TROPONINI in the last 168 hours. BNP (last 3 results) No results for input(s): PROBNP in the last 8760 hours. HbA1C: No results for input(s): HGBA1C in the last 72 hours. CBG: Recent Labs  Lab 09/07/21 1620  GLUCAP 121*   Lipid Profile: No  results for input(s): CHOL, HDL, LDLCALC, TRIG, CHOLHDL, LDLDIRECT in the last 72 hours. Thyroid Function Tests: No results for input(s): TSH, T4TOTAL, FREET4, T3FREE, THYROIDAB in the last 72 hours. Anemia Panel: No results for input(s): VITAMINB12, FOLATE, FERRITIN, TIBC, IRON, RETICCTPCT in the last 72 hours. Sepsis Labs: Recent Labs  Lab 09/07/21 [place] <0.10    Recent Results (from the past 240 hour(s))  Resp Panel by RT-PCR (Flu A&B, Covid) Nasopharyngeal Swab     Status: None   Collection Time: 09/07/21 12:52 AM   Specimen: Nasopharyngeal Swab; Nasopharyngeal(NP) swabs in vial transport medium  Result Value Ref Range Status   SARS Coronavirus 2 by RT PCR NEGATIVE NEGATIVE Final    Comment: (NOTE) SARS-CoV-2 target nucleic acids are NOT DETECTED.  The SARS-CoV-2 RNA is generally detectable in upper respiratory specimens during the acute phase of infection. The lowest concentration of SARS-CoV-2 viral copies this assay can detect is 138 copies/mL. A negative result does not preclude SARS-Cov-2 infection and should not be used as the sole basis for treatment or other patient management decisions. A negative result may occur with  improper specimen collection/handling, submission of specimen other than nasopharyngeal swab, presence of viral mutation(s) within the areas targeted by this assay, and inadequate number of viral copies(<138 copies/mL). A negative result must be combined with clinical observations, patient history, and epidemiological information. The expected result is Negative.  Fact Sheet for Patients:  EntrepreneurPulse.com.au  Fact Sheet for Healthcare Providers:  IncredibleEmployment.be  This test is no t yet approved or cleared by the [place] FDA and  has been authorized for detection and/or diagnosis of SARS-CoV-2 by FDA under an Emergency Use Authorization (EUA). This EUA will remain  in effect  (meaning this test can be used) for the duration of the COVID-19 declaration under Section 564(b)(1) of the Act, 21 U.S.C.section 360bbb-3(b)(1), unless the authorization is terminated  or revoked sooner.       Influenza A by PCR NEGATIVE NEGATIVE Final   Influenza B by PCR NEGATIVE NEGATIVE Final    Comment: (NOTE) The Xpert Xpress SARS-CoV-2/FLU/RSV plus assay is intended as an aid in the diagnosis of influenza from Nasopharyngeal swab specimens and should not be used as a sole basis for treatment. Nasal washings and aspirates are unacceptable for Xpert Xpress SARS-CoV-2/FLU/RSV testing.  Fact Sheet for Patients: EntrepreneurPulse.com.au  Fact Sheet for Healthcare Providers: IncredibleEmployment.be  This test is not yet approved or cleared by the [place] FDA and has been authorized for detection and/or diagnosis of SARS-CoV-2 by FDA under an Emergency Use Authorization (EUA). This EUA will remain in effect (meaning this test can be used) for the duration of the COVID-19 declaration under Section 564(b)(1) of the Act, 21 U.S.C. section 360bbb-3(b)(1), unless the authorization  is terminated or revoked.  Performed at [facility], [address], [place], [place] [address]       Radiology Studies: No results found.   Scheduled Meds:  Chlorhexidine Gluconate Cloth  6 each Topical Daily   enoxaparin (LOVENOX) injection  0.5 mg/kg Subcutaneous Q24H   levothyroxine  50 mcg Oral Q0600   montelukast  10 mg Oral QHS   pantoprazole (PROTONIX) IV  40 mg Intravenous QHS   Continuous Infusions:  sodium chloride 50 mL/hr at 09/11/21 1237   piperacillin-tazobactam 3.375 g (09/11/21 1531)     LOS: 4 days     Enzo Bi, MD [facility] If 7PM-7AM, please contact night-coverage 09/11/2021, 4:19 PM

## 2021-09-11 NOTE — Progress Notes (Signed)
[facility] Hospital Day(s): 4.   Post op day(s): 2 Days Post-Op.   Interval History: Patient seen and examined, no acute events or new complaints overnight. Patient reports abdominal wall pain with mobilization to bathroom.  Denies nausea or vomiting, initiating clear liquid diet, quite cautiously with significant anorexia.  Low-grade temp, initiating incentive spirometry.  Not much more gas or stool output from stoma aside from the initial large output.   Vital signs in last 24 hours: [min-max] current  Temp:  [98.3 F (36.8 C)-100.9 F (38.3 C)] 99.1 F (37.3 C) (10/16 0759) Pulse Rate:  [88-92] 88 (10/16 0759) Resp:  [12-18] 12 (10/16 0759) BP: (109-111)/(46-64) 111/48 (10/16 0759) SpO2:  [92 %-93 %] 93 % (10/16 0759)     Height: 5\' 6"  (167.6 cm) Weight: 98 kg BMI (Calculated): 34.88   Intake/Output last 2 shifts:  10/15 0701 - 10/16 0700 In: 1540.8 [P.O.:240; I.V.:1146.4; IV Piggyback:154.4] Out: 570 [Urine:550; Stool:20]   Physical Exam:  Constitutional: alert, cooperative and no distress  Respiratory: breathing non-labored at rest  Cardiovascular: regular rate and sinus rhythm  Gastrointestinal: Prevena VAC over incision intact.  Stomal appliance still attached.  Edematous large stoma clearly viable.  Otherwise soft, non-tender, and minimally improved distention   Labs:  CBC Latest Ref Rng & Units 09/11/2021 09/10/2021 09/09/2021  WBC 4.0 - 10.5 K/uL 17.1(H) 13.9(H) 9.5  Hemoglobin 12.0 - 15.0 g/dL 12.8 13.9 13.5  Hematocrit 36.0 - 46.0 % 37.2 39.3 40.2  Platelets 150 - 400 K/uL 233 240 217   CMP Latest Ref Rng & Units 09/11/2021 09/10/2021 09/09/2021  Glucose 70 - 99 mg/dL 140(H) 135(H) 99  BUN 8 - 23 mg/dL 20 22 13   Creatinine 0.44 - 1.00 mg/dL 1.37(H) 1.49(H) 1.07(H)  Sodium 135 - 145 mmol/L 134(L) 133(L) 134(L)  Potassium 3.5 - 5.1 mmol/L 4.3 5.0 3.7  Chloride 98 - 111 mmol/L 99 99 101  CO2 22 - 32 mmol/L 25 27 24    Calcium 8.9 - 10.3 mg/dL 8.5(L) 8.0(L) 8.1(L)  Total Protein 6.5 - 8.1 g/dL - - -  Total Bilirubin 0.3 - 1.2 mg/dL - - -  Alkaline Phos 38 - 126 U/L - - -  AST 15 - 41 U/L - - -  ALT 0 - 44 U/L - - -     Imaging studies: No new pertinent imaging studies   Assessment/Plan:  70 y.o. female with  2 Days Post-Op s/p Hartman's procedure for what appears to be a benign sigmoid stricture, likely secondary to diverticulitis, complicated by pertinent comorbidities including obesity.  Patient Active Problem List   Diagnosis Date Noted   Acute diverticulitis 09/07/2021   Hx of colonic polyps - diminutive adenomas 10/12/2006    -Continue clear liquid diets cautiously, would not advance until further evidence of stomal output.  -Apply abdominal binder securely for improved mobility.  -WBC trending upward, will continue to monitor while on broad-spectrum antibiotics.   -- Ronny Bacon, M.D., [facility] 09/11/2021

## 2021-09-12 LAB — BASIC METABOLIC PANEL
Anion gap: 7 (ref 5–15)
BUN: 21 mg/dL (ref 8–23)
CO2: 26 mmol/L (ref 22–32)
Calcium: 8 mg/dL — ABNORMAL LOW (ref 8.9–10.3)
Chloride: 101 mmol/L (ref 98–111)
Creatinine, Ser: 1.19 mg/dL — ABNORMAL HIGH (ref 0.44–1.00)
GFR, Estimated: 49 mL/min — ABNORMAL LOW (ref >60)
Glucose, Bld: 94 mg/dL (ref 70–99)
Potassium: 4 mmol/L (ref 3.5–5.1)
Sodium: 134 mmol/L — ABNORMAL LOW (ref 135–145)

## 2021-09-12 LAB — CBC
HCT: 34.6 % — ABNORMAL LOW (ref 36.0–46.0)
Hemoglobin: 11.3 g/dL — ABNORMAL LOW (ref 12.0–15.0)
MCH: 32.3 pg (ref 26.0–34.0)
MCHC: 32.7 g/dL (ref 30.0–36.0)
MCV: 98.9 fL (ref 80.0–100.0)
Platelets: 223 10*3/uL (ref 150–400)
RBC: 3.5 MIL/uL — ABNORMAL LOW (ref 3.87–5.11)
RDW: 12.5 % (ref 11.5–15.5)
WBC: 13.9 10*3/uL — ABNORMAL HIGH (ref 4.0–10.5)
nRBC: 0 % (ref 0.0–0.2)

## 2021-09-12 LAB — SURGICAL PATHOLOGY

## 2021-09-12 LAB — MAGNESIUM: Magnesium: 2.5 mg/dL — ABNORMAL HIGH (ref 1.7–2.4)

## 2021-09-12 MED ORDER — OXYCODONE HCL 5 MG PO TABS
5.0000 mg | ORAL_TABLET | ORAL | Status: DC | PRN
  Administered 2021-09-12: 5 mg via ORAL
  Filled 2021-09-12: qty 1

## 2021-09-12 MED ORDER — MORPHINE SULFATE (PF) 2 MG/ML IV SOLN
2.0000 mg | INTRAVENOUS | Status: DC | PRN
Start: 2021-09-12 — End: 2021-09-13
  Administered 2021-09-12: 2 mg via INTRAVENOUS
  Filled 2021-09-12: qty 1

## 2021-09-12 NOTE — Progress Notes (Signed)
PROGRESS NOTE    Abigail Mitchell  FHL:[id number] DOB: June 05, 1951 DOA: 09/07/2021 PCP: Crist Infante, MD  [phone]   Assessment & Plan:   Active Problems:   Acute diverticulitis   Abigail Mitchell is a 70 y.o. female with medical history significant for diverticulitis, dyslipidemia and hypothyroidism, who presented to emergency room with acute onset of lower abdominal pain worse in the left lower quadrant with associated vomiting and bloating.   1.  Acute diverticulitis with associated colitis. --started on IV zosyn on presentation. 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  Dyslipidemia. --resume statin after discharge  4.  Hypothyroidism. --cont Synthroid   DVT prophylaxis: Lovenox SQ Code Status: Full code  Family Communication:  Level of care: Med-Surg Dispo:   The patient is from: home Anticipated d/c is to: pending PT eval Anticipated d/c date is: 2-3 days Patient currently is not medically ready to d/c due to: need to advance diet, also on IV abx   Subjective and Interval History:  No fever overnight.  Pt reported feeling a little better.  No N/V.     Objective: Vitals:   09/11/21 2047 09/12/21 0447 09/12/21 0852 09/12/21 0959  BP: (!) 108/46 (!) 121/50 (!) 89/75 (!) 99/45  Pulse: 84 77 76 76  Resp: 20 20 16    Temp:  98.7 F (37.1 C) 97.9 F (36.6 C)   TempSrc:  Oral Oral   SpO2: 95% 97% 94%   Weight:      Height:        Intake/Output Summary (Last 24 hours) at 09/12/2021 1321 Last data filed at 09/12/2021 1200 Gross per 24 hour  Intake 120 ml  Output 1200 ml  Net -1080 ml   Filed Weights   09/06/21 1921 09/09/21 0816  Weight: 98 kg 98 kg     Examination:   Constitutional: NAD, AAOx3 HEENT: conjunctivae and lids normal, EOMI CV: No cyanosis.   RESP: normal respiratory effort, on RA GI: small amount of brown stool at the opening of stoma Extremities: No effusions, edema in BLE SKIN: warm, dry Neuro: II - XII grossly intact.   Psych: Normal mood and affect.  Appropriate judgement and reason   Data Reviewed: I have personally reviewed following labs and imaging studies  CBC: Recent Labs  Lab 09/08/21 0513 09/09/21 0431 09/10/21 0459 09/11/21 0453 09/12/21 0542  WBC 8.9 9.5 13.9* 17.1* 13.9*  HGB 13.5 13.5 13.9 12.8 11.3*  HCT 38.3 40.2 39.3 37.2 34.6*  MCV 99.7 98.5 99.5 99.5 98.9  PLT 203 217 240 233 157   Basic Metabolic Panel: Recent Labs  Lab 09/08/21 0513 09/09/21 0431 09/10/21 0459 09/11/21 0453 09/12/21 0542  NA 132* 134* 133* 134* 134*  K 3.6 3.7 5.0 4.3 4.0  CL 103 101 99 99 101  CO2 25 24 27 25 26   GLUCOSE 110* 99 135* 140* 94  BUN 12 13 22 20 21   CREATININE 1.06* 1.07* 1.49* 1.37* 1.19*  CALCIUM 7.9* 8.1* 8.0* 8.5* 8.0*  MG 2.5* 2.5* 2.2 2.5* 2.5*   GFR: Estimated Creatinine Clearance: 51.9 mL/min (A) (by C-G formula based on SCr of 1.19 mg/dL (H)). Liver Function Tests: Recent Labs  Lab 09/06/21 1927  AST 23  ALT  19  ALKPHOS 62  BILITOT 0.8  PROT 7.6  ALBUMIN 4.1   Recent Labs  Lab 09/06/21 1927  LIPASE 32   No results for input(s): AMMONIA in the last 168 hours. Coagulation Profile: No results for input(s): INR, PROTIME in the last 168 hours. Cardiac Enzymes: No results for input(s): CKTOTAL, CKMB, CKMBINDEX, TROPONINI in the last 168 hours. BNP (last 3 results) No results for input(s): PROBNP in the last 8760 hours. HbA1C: No results for input(s): HGBA1C in the last 72 hours. CBG: Recent Labs  Lab 09/07/21 1620  GLUCAP 121*   Lipid Profile: No results for input(s): CHOL, HDL, LDLCALC, TRIG, CHOLHDL, LDLDIRECT in the last 72 hours. Thyroid Function  Tests: No results for input(s): TSH, T4TOTAL, FREET4, T3FREE, THYROIDAB in the last 72 hours. Anemia Panel: No results for input(s): VITAMINB12, FOLATE, FERRITIN, TIBC, IRON, RETICCTPCT in the last 72 hours. Sepsis Labs: Recent Labs  Lab 09/07/21 [place] <0.10    Recent Results (from the past 240 hour(s))  Resp Panel by RT-PCR (Flu A&B, Covid) Nasopharyngeal Swab     Status: None   Collection Time: 09/07/21 12:52 AM   Specimen: Nasopharyngeal Swab; Nasopharyngeal(NP) swabs in vial transport medium  Result Value Ref Range Status   SARS Coronavirus 2 by RT PCR NEGATIVE NEGATIVE Final    Comment: (NOTE) SARS-CoV-2 target nucleic acids are NOT DETECTED.  The SARS-CoV-2 RNA is generally detectable in upper respiratory specimens during the acute phase of infection. The lowest concentration of SARS-CoV-2 viral copies this assay can detect is 138 copies/mL. A negative result does not preclude SARS-Cov-2 infection and should not be used as the sole basis for treatment or other patient management decisions. A negative result may occur with  improper specimen collection/handling, submission of specimen other than nasopharyngeal swab, presence of viral mutation(s) within the areas targeted by this assay, and inadequate number of viral copies(<138 copies/mL). A negative result must be combined with clinical observations, patient history, and epidemiological information. The expected result is Negative.  Fact Sheet for Patients:  EntrepreneurPulse.com.au  Fact Sheet for Healthcare Providers:  IncredibleEmployment.be  This test is no t yet approved or cleared by the [place] FDA and  has been authorized for detection and/or diagnosis of SARS-CoV-2 by FDA under an Emergency Use Authorization (EUA). This EUA will remain  in effect (meaning this test can be used) for the duration of the COVID-19 declaration under Section 564(b)(1) of the Act,  21 U.S.C.section 360bbb-3(b)(1), unless the authorization is terminated  or revoked sooner.       Influenza A by PCR NEGATIVE NEGATIVE Final   Influenza B by PCR NEGATIVE NEGATIVE Final    Comment: (NOTE) The Xpert Xpress SARS-CoV-2/FLU/RSV plus assay is intended as an aid in the diagnosis of influenza from Nasopharyngeal swab specimens and should not be used as a sole basis for treatment. Nasal washings and aspirates are unacceptable for Xpert Xpress SARS-CoV-2/FLU/RSV testing.  Fact Sheet for Patients: EntrepreneurPulse.com.au  Fact Sheet for Healthcare Providers: IncredibleEmployment.be  This test is not yet approved or cleared by the [place] FDA and has been authorized for detection and/or diagnosis of SARS-CoV-2 by FDA under an Emergency Use Authorization (EUA). This EUA will remain in effect (meaning this test can be used) for the duration of the COVID-19 declaration under Section 564(b)(1) of the Act, 21 U.S.C. section 360bbb-3(b)(1), unless the authorization is terminated or revoked.  Performed at [facility], [address] [place], [place] [address]  Radiology Studies: No results found.   Scheduled Meds:  Chlorhexidine Gluconate Cloth  6 each Topical Daily   enoxaparin (LOVENOX) injection  0.5 mg/kg Subcutaneous Q24H   levothyroxine  50 mcg Oral Q0600   montelukast  10 mg Oral QHS   pantoprazole (PROTONIX) IV  40 mg Intravenous QHS   Continuous Infusions:  sodium chloride 50 mL/hr at 09/11/21 2046   piperacillin-tazobactam 3.375 g (09/12/21 0615)     LOS: 5 days     Enzo Bi, MD [facility] If 7PM-7AM, please contact night-coverage 09/12/2021, 1:21 PM

## 2021-09-12 NOTE — Progress Notes (Signed)
[facility] Hospital Day(s): 5.   Post op day(s): 3 Days Post-Op.   Interval History:  Patient seen and examined No acute events or new complaints overnight.  Patient she continues to have abdominal soreness but this is improving, her distension is improving as well No fever, chills, nausea, emesis Leukocytosis is improving; down to 13.9K Renal function is improving; sCr - 1.19; UO - 600 No significant electrolyte derangements Colostomy output recorded at 500 ccs She is on CLD; tolerating well  Vital signs in last 24 hours: [min-max] current  Temp:  [98.7 F (37.1 C)-99.1 F (37.3 C)] 98.7 F (37.1 C) (10/17 0447) Pulse Rate:  [77-88] 77 (10/17 0447) Resp:  [12-20] 20 (10/17 0447) BP: (108-121)/(46-55) 121/50 (10/17 0447) SpO2:  [93 %-97 %] 97 % (10/17 0447)     Height: 5\' 6"  (167.6 cm) Weight: 98 kg BMI (Calculated): 34.88   Intake/Output last 2 shifts:  10/16 0701 - 10/17 0700 In: 458.4 [I.V.:412.8; IV Piggyback:45.5] Out: 1100 [Urine:600; Stool:500]   Physical Exam:  Constitutional: alert, cooperative and no distress  Respiratory: breathing non-labored at rest  Cardiovascular: regular rate and sinus rhythm  Gastrointestinal: Soft, incisional soreness, non-distended, no rebound/guarding. Colostomy in the left mid abdomen, patent, significant gas and stool in bag  Integumentary: Prevena to midline wound, unfortunately it appears this is pulling stool into the vac, will discontinue   Labs:  CBC Latest Ref Rng & Units 09/12/2021 09/11/2021 09/10/2021  WBC 4.0 - 10.5 K/uL 13.9(H) 17.1(H) 13.9(H)  Hemoglobin 12.0 - 15.0 g/dL 11.3(L) 12.8 13.9  Hematocrit 36.0 - 46.0 % 34.6(L) 37.2 39.3  Platelets 150 - 400 K/uL 223 233 240   CMP Latest Ref Rng & Units 09/12/2021 09/11/2021 09/10/2021  Glucose 70 - 99 mg/dL 94 140(H) 135(H)  BUN 8 - 23 mg/dL 21 20 22   Creatinine 0.44 - 1.00 mg/dL 1.19(H) 1.37(H) 1.49(H)  Sodium 135 - 145 mmol/L  134(L) 134(L) 133(L)  Potassium 3.5 - 5.1 mmol/L 4.0 4.3 5.0  Chloride 98 - 111 mmol/L 101 99 99  CO2 22 - 32 mmol/L 26 25 27   Calcium 8.9 - 10.3 mg/dL 8.0(L) 8.5(L) 8.0(L)  Total Protein 6.5 - 8.1 g/dL - - -  Total Bilirubin 0.3 - 1.2 mg/dL - - -  Alkaline Phos 38 - 126 U/L - - -  AST 15 - 41 U/L - - -  ALT 0 - 44 U/L - - -     Imaging studies: No new pertinent imaging studies   Assessment/Plan:  70 y.o. female 3 Days Post-Op s/p Hartmann's Procedure for large bowel obstruction   - Will advance to full liquid diet - Wean from IVF resuscitation as diet advances   - Continue IV Abx (Zosyn) for now - Monitor abdominal examination; on-going colostomy function - Pain control prn; antiemetics prn   - Appreciate WOC RN assistance - Discontinue Prevena today   - Mobilization encouraged; engage therapies - Further management per primary service; we will follow    All of the above findings and recommendations were discussed with the patient, and the medical team, and all of patient's questions were answered to her expressed satisfaction.  -- Edison Simon, PA-C [facility] 09/12/2021, 7:28 AM [phone] M-F: 7am - 4pm

## 2021-09-12 NOTE — Consult Note (Signed)
[facility] Nurse ostomy consult note Stoma type/location: LUQ, end colostomy  Stomal assessment/size: 2 1/4" round, budded, very edematous  Peristomal assessment: intact  Treatment options for stomal/peristomal skin: 2" skin barrier ring Output liquid brown Ostomy pouching: 2pc. 4" skin barrier and pouch for now.  Stoma is too large to place in largest "regular" pouch, can transition to 2 3/4" when edema has lessened. 2" skin barrier ring used as well, the stoma is very close to midline incision with staples and was leaking in that direction when I arrived  Education provided:  Explained role of ostomy nurse and creation of stoma  Explained stoma characteristics (budded, flush, color, texture, care) Demonstrated pouch change (cutting new skin barrier, measuring stoma, cleaning peristomal skin and stoma, use of barrier ring) Education on emptying when 1/3 to 1/2 full and how to empty Demonstrated use of wick to clean spout  Discussed bathing, diet, gas Disscused and demonstrate use of 4" skin barrier and pouch and rationale for use. We looked at 2 3/4" pouches and discussed why she can not use currently but should be able to when some the edema resolves with her current stoma. She is engaged to learn.  Lives with husband who is a Psychologist, sport and exercise. She is not sure how much he will assist her but will talk with him today and plan for him to try to be here tomorrow am around the same time as my visit today. She also has next door neighbor that will be able to help some   Feel that she will need HHRN to support her at DC to continue to learn ostomy care. The proximity of the stoma to the midline wound is going to be a challenge for her as well as the size of the stoma currently and the use of larger pouch. We off centered pouch today to try to accommodate both.  PA with surgery will add HHRN orders.  I will touch base with TOC about the need for the larger pouches.    Enrolled patient in [facility]  program: Yes 4" pouchs, barrier rings, 2 3/4" 2pc beige with filter, lubrication and deodorizing drops    Patient anxious about pending DC, discussed that I will visit tomorrow and Wednesday   WOC Nurse Consult Note: Reason for Consult: midline wound with Prevena dressing; however stool is leaking into OR dressing  Wound type: surgical with staples  Pressure Injury POA: NA Measurement:25cm  Wound bed: closed incision with staples and 3 1/4" packing strip wicks in place.  Left in place, surgery to remove  Drainage (amount, consistency, odor) serosanguinous; actively oozing at most distal portion of the incision at the wick   Periwound:intact  Dressing procedure/placement/frequency:removed Prevena dressing per orders; replaced with dry gauze dressing, secure with tape.        [place], [place], [place]

## 2021-09-12 NOTE — Care Management Important Message (Signed)
Important Message  Patient Details  Name: Abigail Mitchell MRN: [id number] Date of Birth: 05-18-1951   Medicare Important Message Given:  Yes     Dannette Barbara 09/12/2021, 2:34 PM

## 2021-09-12 NOTE — Evaluation (Signed)
Physical Therapy Evaluation Patient Details Name: Abigail Mitchell MRN: [id number] DOB: June 10, 1951 Today's Date: 09/12/2021  History of Present Illness  Pt is a 70 y.o. F presenting to ED c/o abdominal pain w/ vomitting, bloating and admitted for acute diverticulitis and partial colectomy 09/09/21 secondary to bowel obstruction. PMH includes hypothyroidism and HLD.  Clinical Impression  Pt alert in bed, oriented x 4 stating 8/10 pain. RN notified and pain meds provided during session decreasing pain to 6/10 following mobility. Pt's PLOF is independent for all mobility and ADLs. Primary limitations are pain with mobility, decrease in functional mobility, and lightheadedness with movement. Pt is orthostatic w/ 5/10 symptoms of dizziness. Performed seated EOB activities and reassessed after orthostatics with no change in BP but decrease in symptoms. Pt transferred to chair with MIN G for safety. Pt taught rolling for bed mobility and maintaining straight spine with forward flexion during STS. Discharge recommendations are HHPT. Skilled PT intervention is indicated to address deficits in function, mobility, and to return to PLOF as able.     Recommendations for follow up therapy are one component of a multi-disciplinary discharge planning process, led by the attending physician.  Recommendations may be updated based on patient status, additional functional criteria and insurance authorization.  Follow Up Recommendations Home health PT    Equipment Recommendations  Rolling walker with 5" wheels;3in1 (PT)    Recommendations for Other Services       Precautions / Restrictions Precautions Precautions: Fall Restrictions Weight Bearing Restrictions: No      Mobility  Bed Mobility Overal bed mobility: Needs Assistance Bed Mobility: Rolling;Supine to Sit Rolling: Supervision   Supine to sit: Min assist     General bed mobility comments: Taught full sidelying rolling w/ verbal, tactile cues w/ pt  reporting less pain w/ technique; MIN A to bring trunk upright at initiation w/ cues to utilze bottom UE for pushing upwards    Transfers Overall transfer level: Needs assistance Equipment used: Rolling walker (2 wheeled) Transfers: Sit to/from Stand Sit to Stand: Min guard         General transfer comment: MIN-G 2/2 lightheadedness, no physical assist provided; verbal cues to maintain straight trunk alignment and hand placement with RW  Ambulation/Gait Ambulation/Gait assistance: Min guard Gait Distance (Feet): 15 Feet Assistive device: Rolling walker (2 wheeled) Gait Pattern/deviations: Step-through pattern Gait velocity: Decreased   General Gait Details: Pt amb in room around bed > recliner w/ RW w/ cues for technique with no LOB, deferred further amb 2/2 orthostatics  Stairs            Wheelchair Mobility    Modified Rankin (Stroke Patients Only)       Balance Overall balance assessment: Needs assistance Sitting-balance support: Feet supported;No upper extremity supported Sitting balance-Leahy Scale: Good Sitting balance - Comments: No support necessary or fwd trunk lean   Standing balance support: During functional activity;Bilateral upper extremity supported;No upper extremity supported Standing balance-Leahy Scale: Good Standing balance comment: Pt able to stand and shift weight without UE support w/o LOB, UE support used to decrease pain w/ amb                             Pertinent Vitals/Pain Pain Assessment: 0-10 Pain Score: 8  Pain Location: abdomen Pain Descriptors / Indicators: Sore Pain Intervention(s): Limited activity within patient's tolerance;Monitored during session;Repositioned;Patient requesting pain meds-RN notified;RN gave pain meds during session    Home Living Family/patient expects  to be discharged to:: Private residence Living Arrangements: Spouse/significant other Available Help at Discharge: Family;Available 24  hours/day Type of Home: House Home Access: Stairs to enter Entrance Stairs-Rails: None Entrance Stairs-Number of Steps: 2 Home Layout: One level Home Equipment: Cane - single point      Prior Function Level of Independence: Independent         Comments: Currently works part-time in [employer], does not utilize an AD for mobility and pt is independent with all ADLs     Hand Dominance        Extremity/Trunk Assessment   Upper Extremity Assessment Upper Extremity Assessment: Overall WFL for tasks assessed    Lower Extremity Assessment Lower Extremity Assessment: Overall WFL for tasks assessed    Cervical / Trunk Assessment Cervical / Trunk Assessment: Normal  Communication   Communication: No difficulties  Cognition Arousal/Alertness: Awake/alert Behavior During Therapy: WFL for tasks assessed/performed Overall Cognitive Status: Within Functional Limits for tasks assessed                                 General Comments: AOx4      General Comments General comments (skin integrity, edema, etc.): Wound wrapped and dry; applied abdominal binder per surgeon's recommendations    Exercises General Exercises - Lower Extremity Ankle Circles/Pumps: AROM;20 reps;Both;Seated Short Arc Quad: Both;15 reps;Seated Long Arc Quad: AROM Hip ABduction/ADduction: AROM;Both;15 reps;Seated Other Exercises Other Exercises: Bed > BSC w/ min-gaurd, RW pt able to perform pericare w/ SUPV; purewick leakage w/ pt clean up, RN notified of bed clean-up Other Exercises: Performed orthostatics 2/2 dizziness:(supine): 104/48, 71, 96% (seated) 108/48, 72, 95% (standing) 79/56, 83, 95% (standing + 2) 78/52   Assessment/Plan    PT Assessment Patient needs continued PT services  PT Problem List Decreased strength;Decreased range of motion;Decreased activity tolerance;Decreased mobility       PT Treatment Interventions Gait training;Stair training;Functional mobility  training;Therapeutic activities;Therapeutic exercise;Neuromuscular re-education    PT Goals (Current goals can be found in the Care Plan section)  Acute Rehab PT Goals Patient Stated Goal: To walk PT Goal Formulation: With patient Time For Goal Achievement: 09/26/21 Potential to Achieve Goals: Good    Frequency Min 2X/week   Barriers to discharge        Co-evaluation               AM-PAC PT "6 Clicks" Mobility  Outcome Measure Help needed turning from your back to your side while in a flat bed without using bedrails?: None Help needed moving from lying on your back to sitting on the side of a flat bed without using bedrails?: A Little Help needed moving to and from a bed to a chair (including a wheelchair)?: A Little Help needed standing up from a chair using your arms (e.g., wheelchair or bedside chair)?: A Little Help needed to walk in hospital room?: A Little Help needed climbing 3-5 steps with a railing? : A Little 6 Click Score: 19    End of Session Equipment Utilized During Treatment: Gait belt Activity Tolerance: Patient tolerated treatment well Patient left: in chair;with call bell/phone within reach;with chair alarm set;with nursing/sitter in room;with SCD's reapplied Nurse Communication: Mobility status PT Visit Diagnosis: Other abnormalities of gait and mobility (R26.89);Muscle weakness (generalized) (M62.81)    Time: 8413-2440 PT Time Calculation (min) (ACUTE ONLY): 48 min   Charges:            [employer],  SPT

## 2021-09-13 LAB — BASIC METABOLIC PANEL
Anion gap: 4 — ABNORMAL LOW (ref 5–15)
BUN: 15 mg/dL (ref 8–23)
CO2: 26 mmol/L (ref 22–32)
Calcium: 7.9 mg/dL — ABNORMAL LOW (ref 8.9–10.3)
Chloride: 105 mmol/L (ref 98–111)
Creatinine, Ser: 1.04 mg/dL — ABNORMAL HIGH (ref 0.44–1.00)
GFR, Estimated: 58 mL/min — ABNORMAL LOW (ref >60)
Glucose, Bld: 92 mg/dL (ref 70–99)
Potassium: 3.6 mmol/L (ref 3.5–5.1)
Sodium: 135 mmol/L (ref 135–145)

## 2021-09-13 LAB — CBC
HCT: 32.9 % — ABNORMAL LOW (ref 36.0–46.0)
Hemoglobin: 11.3 g/dL — ABNORMAL LOW (ref 12.0–15.0)
MCH: 34.3 pg — ABNORMAL HIGH (ref 26.0–34.0)
MCHC: 34.3 g/dL (ref 30.0–36.0)
MCV: 100 fL (ref 80.0–100.0)
Platelets: 241 10*3/uL (ref 150–400)
RBC: 3.29 MIL/uL — ABNORMAL LOW (ref 3.87–5.11)
RDW: 12.3 % (ref 11.5–15.5)
WBC: 10.8 10*3/uL — ABNORMAL HIGH (ref 4.0–10.5)
nRBC: 0 % (ref 0.0–0.2)

## 2021-09-13 LAB — MAGNESIUM: Magnesium: 2.1 mg/dL (ref 1.7–2.4)

## 2021-09-13 NOTE — Progress Notes (Signed)
PROGRESS NOTE    DONNE ROBILLARD  PZW:[id number] DOB: 12-26-1950 DOA: 09/07/2021 PCP: Crist Infante, MD  [phone]   Assessment & Plan:   Active Problems:   Acute diverticulitis   SATHVIKA OJO is a 70 y.o. female with medical history significant for diverticulitis, dyslipidemia and hypothyroidism, who presented to emergency room with acute onset of lower abdominal pain worse in the left lower quadrant with associated vomiting and bloating.   1.  Acute diverticulitis with associated colitis. --started on IV zosyn on presentation. --had mild fever post-op Plan: --cont IV zosyn, day 8 today  Large bowel obstruction S/p colectomy with colostomy creation on 10/14 --concerning for obstructing mass, biopsy sent Plan: --advance to soft diet today by Surgery --d/c MIVF today  Post-op fever and leukocytosis, improved --cont IV zosyn, day 8 today --need to monitor for intra-abdominal infection   3.  Dyslipidemia. --resume statin after discharge  4.  Hypothyroidism. --cont Synthroid   DVT prophylaxis: Lovenox SQ Code Status: Full code  Family Communication:  Level of care: Med-Surg Dispo:   The patient is from: home Anticipated d/c is to: home with [facility]  Anticipated d/c date is: likely tomorrow, per GenSurg  Patient currently is not medically ready to d/c due to: need to advance diet, GenSurg to clear for discharge   Subjective and Interval History:  Pt reported abdomen still sore.  Tolerated soft diet.     Objective: Vitals:   09/12/21 1954 09/13/21 0507 09/13/21 0832 09/13/21 1538  BP: (!) 107/58 114/64 (!) 129/53 118/60  Pulse: 73 67 69 71  Resp: 18 20 20 20   Temp: 98.5 F (36.9 C) 98.2 F (36.8 C) 98.2 F (36.8 C) 98.3 F (36.8 C)  TempSrc: Oral Oral Oral Oral  SpO2: 96% 97% 97% 99%  Weight:      Height:        Intake/Output Summary (Last 24 hours) at 09/13/2021 1543 Last data filed at 09/13/2021 1055 Gross per 24 hour  Intake 360 ml  Output --  Net  360 ml   Filed Weights   09/06/21 1921 09/09/21 0816  Weight: 98 kg 98 kg    Examination:   Constitutional: NAD, AAOx3, sitting in recliner HEENT: conjunctivae and lids normal, EOMI CV: No cyanosis.   RESP: normal respiratory effort, on RA GI: loose brown stool in ostomy bag Extremities: No effusions, edema in BLE SKIN: warm, dry Neuro: II - XII grossly intact.   Psych: Normal mood and affect.  Appropriate judgement and reason   Data Reviewed: I have personally reviewed following labs and imaging studies  CBC: Recent Labs  Lab 09/09/21 0431 09/10/21 0459 09/11/21 0453 09/12/21 0542 09/13/21 0622  WBC 9.5 13.9* 17.1* 13.9* 10.8*  HGB 13.5 13.9 12.8 11.3* 11.3*  HCT 40.2 39.3 37.2 34.6* 32.9*  MCV 98.5 99.5 99.5 98.9 100.0  PLT 217 240 233 223 154   Basic Metabolic Panel: Recent Labs  Lab 09/09/21 0431 09/10/21 0459 09/11/21 0453 09/12/21 0542 09/13/21 0622  NA 134* 133* 134* 134* 135  K 3.7 5.0 4.3 4.0 3.6  CL 101 99 99 101 105  CO2 24 27 25 26 26   GLUCOSE 99 135* 140* 94 92  BUN 13 22 20 21 15   CREATININE 1.07* 1.49* 1.37* 1.19* 1.04*  CALCIUM 8.1* 8.0* 8.5* 8.0* 7.9*  MG 2.5* 2.2 2.5* 2.5* 2.1   GFR: Estimated Creatinine Clearance: 59.4 mL/min (A) (by C-G formula based on SCr of 1.04 mg/dL (H)). Liver Function Tests: Recent Labs  Lab 09/06/21 1927  AST 23  ALT 19  ALKPHOS 62  BILITOT 0.8  PROT 7.6  ALBUMIN 4.1   Recent Labs  Lab 09/06/21 1927  LIPASE 32   No results for input(s): AMMONIA in the last 168 hours. Coagulation Profile: No results for input(s): INR, PROTIME in the last 168 hours. Cardiac Enzymes: No results for input(s): CKTOTAL, CKMB, CKMBINDEX, TROPONINI in the last 168 hours. BNP (last 3 results) No results for input(s): PROBNP in the last 8760 hours. HbA1C: No results for input(s): HGBA1C in the last 72 hours. CBG: Recent Labs  Lab 09/07/21 1620  GLUCAP 121*   Lipid Profile: No results for input(s): CHOL, HDL,  LDLCALC, TRIG, CHOLHDL, LDLDIRECT in the last 72 hours. Thyroid Function Tests: No results for input(s): TSH, T4TOTAL, FREET4, T3FREE, THYROIDAB in the last 72 hours. Anemia Panel: No results for input(s): VITAMINB12, FOLATE, FERRITIN, TIBC, IRON, RETICCTPCT in the last 72 hours. Sepsis Labs: Recent Labs  Lab 09/07/21 [place] <0.10    Recent Results (from the past 240 hour(s))  Resp Panel by RT-PCR (Flu A&B, Covid) Nasopharyngeal Swab     Status: None   Collection Time: 09/07/21 12:52 AM   Specimen: Nasopharyngeal Swab; Nasopharyngeal(NP) swabs in vial transport medium  Result Value Ref Range Status   SARS Coronavirus 2 by RT PCR NEGATIVE NEGATIVE Final    Comment: (NOTE) SARS-CoV-2 target nucleic acids are NOT DETECTED.  The SARS-CoV-2 RNA is generally detectable in upper respiratory specimens during the acute phase of infection. The lowest concentration of SARS-CoV-2 viral copies this assay can detect is 138 copies/mL. A negative result does not preclude SARS-Cov-2 infection and should not be used as the sole basis for treatment or other patient management decisions. A negative result may occur with  improper specimen collection/handling, submission of specimen other than nasopharyngeal swab, presence of viral mutation(s) within the areas targeted by this assay, and inadequate number of viral copies(<138 copies/mL). A negative result must be combined with clinical observations, patient history, and epidemiological information. The expected result is Negative.  Fact Sheet for Patients:  EntrepreneurPulse.com.au  Fact Sheet for Healthcare Providers:  IncredibleEmployment.be  This test is no t yet approved or cleared by the [place] FDA and  has been authorized for detection and/or diagnosis of SARS-CoV-2 by FDA under an Emergency Use Authorization (EUA). This EUA will remain  in effect (meaning this test can be used) for the  duration of the COVID-19 declaration under Section 564(b)(1) of the Act, 21 U.S.C.section 360bbb-3(b)(1), unless the authorization is terminated  or revoked sooner.       Influenza A by PCR NEGATIVE NEGATIVE Final   Influenza B by PCR NEGATIVE NEGATIVE Final    Comment: (NOTE) The Xpert Xpress SARS-CoV-2/FLU/RSV plus assay is intended as an aid in the diagnosis of influenza from Nasopharyngeal swab specimens and should not be used as a sole basis for treatment. Nasal washings and aspirates are unacceptable for Xpert Xpress SARS-CoV-2/FLU/RSV testing.  Fact Sheet for Patients: EntrepreneurPulse.com.au  Fact Sheet for Healthcare Providers: IncredibleEmployment.be  This test is not yet approved or cleared by the [place] FDA and has been authorized for detection and/or diagnosis of SARS-CoV-2 by FDA under an Emergency Use Authorization (EUA). This EUA will remain in effect (meaning this test can be used) for the duration of the COVID-19 declaration under Section 564(b)(1) of the Act, 21 U.S.C. section 360bbb-3(b)(1), unless the authorization is terminated or revoked.  Performed at [facility] [place]  [address], [place], [place] [address]       Radiology Studies: No results found.   Scheduled Meds:  Chlorhexidine Gluconate Cloth  6 each Topical Daily   enoxaparin (LOVENOX) injection  0.5 mg/kg Subcutaneous Q24H   levothyroxine  50 mcg Oral Q0600   montelukast  10 mg Oral QHS   pantoprazole (PROTONIX) IV  40 mg Intravenous QHS   Continuous Infusions:  piperacillin-tazobactam 3.375 g (09/13/21 0645)     LOS: 6 days     Enzo Bi, MD [facility] If 7PM-7AM, please contact night-coverage 09/13/2021, 3:43 PM

## 2021-09-13 NOTE — Progress Notes (Addendum)
[facility] Hospital Day(s): 6.   Post op day(s): 4 Days Post-Op.   Interval History:  Patient seen and examined No acute events or new complaints overnight.  Patient reports she had a good night sleep and is feeling better No fever, chills, nausea, emesis  Leukocytosis has nearly resolved this morning; 10.8K Renal function is improving; sCr - 1.04; UO - unmeasured No significant electrolyte derangements Colostomy output recorded at 500 ccs She is on full liquid diet; tolerating well Worked with therapies; recommending HHPT  Vital signs in last 24 hours: [min-max] current  Temp:  [97.9 F (36.6 C)-98.5 F (36.9 C)] 98.2 F (36.8 C) (10/18 0507) Pulse Rate:  [67-76] 67 (10/18 0507) Resp:  [16-20] 20 (10/18 0507) BP: (89-114)/(45-75) 114/64 (10/18 0507) SpO2:  [94 %-97 %] 97 % (10/18 0507)     Height: 5\' 6"  (167.6 cm) Weight: 98 kg BMI (Calculated): 34.88   Intake/Output last 2 shifts:  10/17 0701 - 10/18 0700 In: 720 [P.O.:720] Out: 100 [Stool:100]   Physical Exam:  Constitutional: alert, cooperative and no distress  Respiratory: breathing non-labored at rest  Cardiovascular: regular rate and sinus rhythm  Gastrointestinal: Soft, incisional soreness, non-distended, no rebound/guarding. Colostomy in the left mid abdomen, patent, significant gas and stool in bag  Integumentary: Laparotomy is CDI with loosely approximated staples, wicks in between, minimal expected erythema, expected serosanguinous drainage    Labs:  CBC Latest Ref Rng & Units 09/13/2021 09/12/2021 09/11/2021  WBC 4.0 - 10.5 K/uL 10.8(H) 13.9(H) 17.1(H)  Hemoglobin 12.0 - 15.0 g/dL 11.3(L) 11.3(L) 12.8  Hematocrit 36.0 - 46.0 % 32.9(L) 34.6(L) 37.2  Platelets 150 - 400 K/uL 241 223 233   CMP Latest Ref Rng & Units 09/13/2021 09/12/2021 09/11/2021  Glucose 70 - 99 mg/dL 92 94 140(H)  BUN 8 - 23 mg/dL 15 21 20   Creatinine 0.44 - 1.00 mg/dL 1.04(H) 1.19(H) 1.37(H)   Sodium 135 - 145 mmol/L 135 134(L) 134(L)  Potassium 3.5 - 5.1 mmol/L 3.6 4.0 4.3  Chloride 98 - 111 mmol/L 105 101 99  CO2 22 - 32 mmol/L 26 26 25   Calcium 8.9 - 10.3 mg/dL 7.9(L) 8.0(L) 8.5(L)  Total Protein 6.5 - 8.1 g/dL - - -  Total Bilirubin 0.3 - 1.2 mg/dL - - -  Alkaline Phos 38 - 126 U/L - - -  AST 15 - 41 U/L - - -  ALT 0 - 44 U/L - - -     Imaging studies: No new pertinent imaging studies   Assessment/Plan:  70 y.o. female 4 Days Post-Op s/p Hartmann's Procedure for large bowel obstruction   - Advance to soft diet - Discontinue IVF   - Continue IV Abx (Zosyn) for now (Day 6); if WBC remains normal tomorrow; we may be able to DC these before discharge  - Monitor abdominal examination; on-going colostomy function - Pain control prn; antiemetics prn   - Appreciate WOC RN assistance   - Mobilization encouraged; therapy on board; recommending HHPT; I did order this yesterday  - Further management per primary service; we will follow     - Discharge Planning; From a surgical perspective she may be ready for discharge this afternoon pending toleration of soft diet. I am okay with waiting until tomorrow for DC as well. Patient reports she needs to figure out her husband's schedule  All of the above findings and recommendations were discussed with the patient, and the medical team, and all of patient's questions were  answered to her expressed satisfaction.  -- Edison Simon, PA-C [facility] 09/13/2021, 7:21 AM [phone] M-F: 7am - 4pm

## 2021-09-13 NOTE — TOC Initial Note (Signed)
Transition of Care [facility]) - Initial/Assessment Note    Patient Details  Name: Abigail Mitchell MRN: [id number] Date of Birth: 1951-02-15  Transition of Care [facility]) CM/SW Contact:    Beverly Sessions, RN Phone Number: 09/13/2021, 2:15 PM  Clinical Narrative:                 Patient admitted from home with diverticulitis. New ostomy Patient states that she lives at home husband  PCP [place] - Patient states either she will be driving to follow up appointments, or her [facility] will [facility]- denies issues obtaining medications  Patient has cane at home PT recommending home health PT Patient agreeable to home health for RN and PT.  Patient states that she does not have a preference of agency. Referral made to [facility] with [place]    Expected Discharge Plan: [place] Barriers to Discharge: Continued Medical Work up   Patient Goals and CMS Choice     Choice offered to / list presented to : Patient  Expected Discharge Plan and Services Expected Discharge Plan: [place]   Discharge Planning Services: CM Consult   Living arrangements for the past 2 months: Single Family Home                 DME Arranged: Walker rolling, 3-N-1 DME Agency: [employer] Date DME Agency Contacted: 09/13/21   Representative spoke with at DME Agency: Faythe Dingwall HH Arranged: RN, PT [place] Agency: [place] ([place]) Date HH Agency Contacted: 09/13/21   Representative spoke with at [place]: Corene Cornea  Prior Living Arrangements/Services Living arrangements for the past 2 months: [place] Lives with:: Spouse Patient language and need for interpreter reviewed:: Yes Do you feel safe going back to the place where you live?: Yes      Need for Family Participation in Patient Care: Yes (Comment) Care giver support system in place?: Yes (comment) Current home services: DME Criminal Activity/Legal Involvement Pertinent to Current  Situation/Hospitalization: No - Comment as needed  Activities of Daily Living Home Assistive Devices/Equipment: Contact lenses ADL Screening (condition at time of admission) Patient's cognitive ability adequate to safely complete daily activities?: Yes Is the patient deaf or have difficulty hearing?: No Does the patient have difficulty seeing, even when wearing glasses/contacts?: No Does the patient have difficulty concentrating, remembering, or making decisions?: No Patient able to express need for assistance with ADLs?: Yes Does the patient have difficulty dressing or bathing?: No Independently performs ADLs?: Yes (appropriate for developmental age) Does the patient have difficulty walking or climbing stairs?: No Weakness of Legs: None Weakness of Arms/Hands: None  Permission Sought/Granted                  Emotional Assessment Appearance:: Appears stated age     Orientation: : Oriented to Self, Oriented to Place, Oriented to  Time, Oriented to Situation Alcohol / Substance Use: Not Applicable Psych Involvement: No (comment)  Admission diagnosis:  Diverticulitis [K57.92] Partial small bowel obstruction ([place]) [K56.600] Acute diverticulitis [K57.92] Left lower quadrant abdominal pain [R10.32] Patient Active Problem List   Diagnosis Date Noted   Acute diverticulitis 09/07/2021   Hx of colonic polyps - diminutive adenomas 10/12/2006   PCP:  Crist Infante, MD Pharmacy[facility]   [id number] [address], [place] [address] Phone: [phone] Fax: [phone]     Social Determinants of Health (SDOH) Interventions    Readmission  Risk Interventions No flowsheet data found.

## 2021-09-13 NOTE — Progress Notes (Signed)
Mobility Specialist - Progress Note   09/13/21 1700  Orthostatic Sitting  BP- Sitting 136/58  Orthostatic Standing at 0 minutes  BP- Standing at 0 minutes 122/72  Orthostatic Standing at 3 minutes  BP- Standing at 3 minutes 121/68  Mobility  Activity Transferred to/from BSC;Stood at bedside  Level of Assistance Minimal assist, patient does 75% or more  Assistive Device Other (Comment) (HHA)  Distance Ambulated (ft) 4 ft  Mobility Out of bed for toileting;Sit up in bed/chair position for meals  Mobility Response Tolerated well  Mobility performed by Mobility specialist  $Mobility charge 1 Mobility    Pt transferred bed-BSC with HHA. 5/10 dizziness with transfer. Orthostatics recorded above. Mild pain at incision site with mobility. No other complaints. Returned supine with supper tray placed in reach.    Kathee Delton Mobility Specialist 09/13/21, 5:21 PM

## 2021-09-13 NOTE — Consult Note (Signed)
[place] Nurse ostomy follow up Stoma type/location: LUQ, end colostomy Stomal assessment/size:  2 1/4" budded, edematous, pink, moist Peristomal assessment: intact but noted this stoma will leak towards the midline because there is no real estate between the stoma and the incision.   For this reason I have been off centering the skin barrier but still appears that it will leak more often to this area. Explained this to patient and her husband today Treatment options for stomal/peristomal skin: using 2" skin barrier ring, used an additional 1/2 of a barrier ring from 6-12 o'clock today to lessen likelihood of  leakage as quick. Fear that when she is more mobile this will be more of an issue Output liquid brown  Ostomy pouching: 2pc. 4" skin barrier/pouch/barrier ring (1) around stoma and 1/2 along medial aspect of stoma between stoma and incision  Education provided:  Met with patient and husband today Demonstrated emptying and cleaning spout of pouch with patient She cut new skin barrier after I drew pattern and husband observed.  Described measuring the stoma and importance of weekly measurement with the amount of edema present currently She removed old pouch and WOC cleansed her skin. She did have large amount of bowel activity while we were changing which was startling to patient but we discussed timing on changing pouch and need to have all supplies close by for this reason  WOC demonstrated application of barrier ring around stoma/medial side of stoma Patient with assistance from [facility] and husband placed skin barrier  Patient applied pouch to the skin barrier flange with minimal difficulty and husband assisted with removal of tape border covers and application of tape to the skin. Difficult for patient to see while in the bed Discussed changing regiment with husband and patient and need to change in the bathroom.  May be more convienent and allow patient to see better in front of the mirror.   Explained to patient need to move around today and see if she can empty the pouch with assistance.  She is anxious about DC and I have reassured her it will be ok for her to DC home with husbands support Provided her with my contact email and phone number for [place].  Suggested she not order 3 months of supplies at a time because of the current size of her stoma and the desire to transition to a smaller skin barrier as soon as possible  Reminded patient about ostomy clinic and importance of follow up to re-assess peristomal skin  (5) 4" skin barriers and pouches in the room.  I will gather some barrier rings for patient tomorrow prior to DC to home   Enrolled patient in [place] Start Discharge program: Yes  Notified TOC that patient is using large ostomy pouch in order for [facility] to be aware.  [employer] item number [id number] 4" pouch/ [employer] item number [id number] skin barrier/[place] # [id number] ostomy barrier ring  [place], [place], [place]

## 2021-09-14 LAB — CBC
HCT: 33.2 % — ABNORMAL LOW (ref 36.0–46.0)
Hemoglobin: 11.7 g/dL — ABNORMAL LOW (ref 12.0–15.0)
MCH: 34.6 pg — ABNORMAL HIGH (ref 26.0–34.0)
MCHC: 35.2 g/dL (ref 30.0–36.0)
MCV: 98.2 fL (ref 80.0–100.0)
Platelets: 278 10*3/uL (ref 150–400)
RBC: 3.38 MIL/uL — ABNORMAL LOW (ref 3.87–5.11)
RDW: 12.3 % (ref 11.5–15.5)
WBC: 10.9 10*3/uL — ABNORMAL HIGH (ref 4.0–10.5)
nRBC: 0 % (ref 0.0–0.2)

## 2021-09-14 LAB — BASIC METABOLIC PANEL
Anion gap: 9 (ref 5–15)
BUN: 10 mg/dL (ref 8–23)
CO2: 24 mmol/L (ref 22–32)
Calcium: 8 mg/dL — ABNORMAL LOW (ref 8.9–10.3)
Chloride: 103 mmol/L (ref 98–111)
Creatinine, Ser: 0.89 mg/dL (ref 0.44–1.00)
GFR, Estimated: >60 mL/min (ref >60)
Glucose, Bld: 98 mg/dL (ref 70–99)
Potassium: 3.3 mmol/L — ABNORMAL LOW (ref 3.5–5.1)
Sodium: 136 mmol/L (ref 135–145)

## 2021-09-14 LAB — MAGNESIUM: Magnesium: 1.9 mg/dL (ref 1.7–2.4)

## 2021-09-14 MED ORDER — POTASSIUM CHLORIDE CRYS ER 20 MEQ PO TBCR
40.0000 meq | EXTENDED_RELEASE_TABLET | Freq: Once | ORAL | Status: AC
  Administered 2021-09-14: 40 meq via ORAL
  Filled 2021-09-14: qty 2

## 2021-09-14 MED ORDER — AMOXICILLIN-POT CLAVULANATE 875-125 MG PO TABS: 1.0000 | ORAL_TABLET | Freq: Two times a day (BID) | ORAL | 0 refills | Status: AC

## 2021-09-14 MED ORDER — OXYCODONE HCL 5 MG PO TABS: 5.0000 mg | ORAL_TABLET | ORAL | 0 refills | Status: DC | PRN

## 2021-09-14 MED ORDER — MAGNESIUM SULFATE 2 GM/50ML IV SOLN
2.0000 g | Freq: Once | INTRAVENOUS | Status: AC
  Administered 2021-09-14: 2 g via INTRAVENOUS
  Filled 2021-09-14: qty 50

## 2021-09-14 NOTE — TOC Progression Note (Signed)
Transition of Care [facility]) - Progression Note    Patient Details  Name: Abigail Mitchell MRN: [id number] Date of Birth: 06/28/1951  [facility]) CM/SW Contact  Beverly Sessions, RN Phone Number: 09/14/2021, 10:20 AM  Clinical Narrative:     Corene Cornea with [facility] confirms they can accept patient for home health.  I notified him to review the [place] notes for specific ostomy supplies   Expected Discharge Plan: [place] Barriers to Discharge: Continued Medical Work up  Expected Discharge Plan and Services Expected Discharge Plan: [place]   Discharge Planning Services: CM Consult   Living arrangements for the past 2 months: Single Family Home                 DME Arranged: Walker rolling, 3-N-1 DME Agency: [employer] Date DME Agency Contacted: 09/13/21   Representative spoke with at DME Agency: Faythe Dingwall HH Arranged: RN, PT [facility] Agency: [place] ([place]) Date [place]: 09/13/21   Representative spoke with at [place]: [place] ([place]) Interventions    Readmission Risk Interventions No flowsheet data found.

## 2021-09-14 NOTE — Progress Notes (Signed)
Physical Therapy Treatment Patient Details Name: Abigail Mitchell MRN: [id number] DOB: 03-Aug-1951 Today's Date: 09/14/2021   History of Present Illness Pt is a 70 y.o. F presenting to ED c/o abdominal pain w/ vomitting, bloating and admitted for acute diverticulitis and partial colectomy 09/09/21 secondary to bowel obstruction. PMH includes hypothyroidism and HLD.    PT Comments    Pt alert, in bed noting 0/10 pain at rest and agreeable to treatment. Pt able to demonstrate appropriate rolling technique to come to EOB w/ supervision. Pt continues to describe lightheadedness with OOB mobility without orthostatic vitals (see below). Symptoms dissipated slightly following upright exercise. Progressed treatment to ambulate with MIN G secondary to lightheadedness for safety. RW used initially due to symptoms but pt able progress to no RW usage without LOB following amb. HHPT remains primary d/c recommendation to progress goals of care. Skilled PT intervention is indicated to address deficits in function, mobility, and to return to PLOF as able.    Vitals (seated) 116/96, 74, 97%; (standing) 115/97   Recommendations for follow up therapy are one component of a multi-disciplinary discharge planning process, led by the attending physician.  Recommendations may be updated based on patient status, additional functional criteria and insurance authorization.  Follow Up Recommendations  Home health PT     Equipment Recommendations  Rolling walker with 5" wheels;3in1 (PT)    Recommendations for Other Services       Precautions / Restrictions Precautions Precautions: Fall Restrictions Weight Bearing Restrictions: No     Mobility  Bed Mobility Overal bed mobility: Needs Assistance Bed Mobility: Rolling;Supine to Sit Rolling: Supervision   Supine to sit: Supervision          Transfers Overall transfer level: Needs assistance Equipment used: Rolling walker (2 wheeled) Transfers: Sit to/from  Stand Sit to Stand: Min guard         General transfer comment: MIN G 2/2 lightheadedness  Ambulation/Gait Ambulation/Gait assistance: Min guard Gait Distance (Feet): 100 Feet Assistive device: None;Rolling walker (2 wheeled) Gait Pattern/deviations: Step-through pattern;Decreased step length - right;Decreased step length - left Gait velocity: Decreased   General Gait Details: Initially amb w/ RW 2/2 lightheadedness w/ close CGA, dizziness improved slightly and amb 40 ft without RW & no LOB   Stairs             Wheelchair Mobility    Modified Rankin (Stroke Patients Only)       Balance Overall balance assessment: Needs assistance Sitting-balance support: Feet supported;No upper extremity supported Sitting balance-Leahy Scale: Normal     Standing balance support: During functional activity;Bilateral upper extremity supported;No upper extremity supported Standing balance-Leahy Scale: Good Standing balance comment: ambulated w/o BUE support with no LOB                            Cognition Arousal/Alertness: Awake/alert Behavior During Therapy: WFL for tasks assessed/performed Overall Cognitive Status: Within Functional Limits for tasks assessed                                 General Comments: AOx4      Exercises Other Exercises Other Exercises: Bed > BSC w/ min-gaurd, RW pt able to perform pericare w/ SUPV Other Exercises: Vitals (seated) 116/96,  74, 97%; (standing) 115/97    General Comments        Pertinent Vitals/Pain Pain Assessment: No/denies pain Pain Intervention(s): Monitored  during session    Home Living                      Prior Function            PT Goals (current goals can now be found in the care plan section) Progress towards PT goals: Progressing toward goals    Frequency    Min 2X/week      PT Plan      Co-evaluation              AM-PAC PT "6 Clicks" Mobility   Outcome  Measure  Help needed turning from your back to your side while in a flat bed without using bedrails?: None Help needed moving from lying on your back to sitting on the side of a flat bed without using bedrails?: A Little Help needed moving to and from a bed to a chair (including a wheelchair)?: None Help needed standing up from a chair using your arms (e.g., wheelchair or bedside chair)?: A Little Help needed to walk in hospital room?: A Little Help needed climbing 3-5 steps with a railing? : A Little 6 Click Score: 20    End of Session Equipment Utilized During Treatment: Gait belt Activity Tolerance: Patient tolerated treatment well Patient left: in chair;with call bell/phone within reach;with chair alarm set;with nursing/sitter in room;with SCD's reapplied Nurse Communication: Mobility status PT Visit Diagnosis: Other abnormalities of gait and mobility (R26.89);Muscle weakness (generalized) (M62.81)     Time: 8588-5027 PT Time Calculation (min) (ACUTE ONLY): 35 min  Charges:                        [employer], SPT

## 2021-09-14 NOTE — Discharge Summary (Signed)
Physician Discharge Summary  RENIAH Mitchell GYJ:[id number] DOB: Aug 02, 1951 DOA: 09/07/2021  PCP: Crist Infante, MD  Admit date: 09/07/2021 Discharge date: 09/14/2021  Admitted From: Home Disposition: Home with home health  Recommendations for Outpatient Follow-up:  Follow up with PCP in 1-2 weeks Follow-up general surgery  Home Health: Yes PT, OT, RN Equipment/Devices: None  Discharge Condition: Stable CODE STATUS: Full Diet recommendation: Soft  Brief/Interim Summary:  Abigail Mitchell is a 70 y.o. female with medical history significant for diverticulitis, dyslipidemia and hypothyroidism, who presented to emergency room with acute onset of lower abdominal pain worse in the left lower quadrant with associated vomiting and bloating.  Developed large bowel obstruction during hospital course.  General surgery consulted.  Patient underwent colectomy with ostomy creation on 10/14.  Followed postoperatively by general surgery.  Diet advanced to soft at time of discharge.  Cleared for discharge from surgery standpoint.  Completed 8 days of IV Zosyn in house.  At time of discharge will de-escalate antibiotic therapy to Augmentin for additional 7 days to cover for intraoperative stool spillage.  Ostomy education done in house.  Stable for discharge home.  Follow-up outpatient general surgery 1 week.   Discharge Diagnoses:  Active Problems:   Acute diverticulitis  1.  Acute diverticulitis with associated colitis. --started on IV zosyn on presentation. --had mild fever post-op Completed 8 days of Zosyn in house.  Transition to Augmentin at time of discharge.  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  Tolerating soft diet.  Stable for discharge home.  Follow-up outpatient general surgery 1 week   Post-op fever and leukocytosis,  improved 8 days of Zosyn in house.  7 days of Augmentin prescribed at time of discharge  3.  Dyslipidemia. --resume statin after discharge  4.  Hypothyroidism. --cont Synthroid  Discharge Instructions  Discharge Instructions     Diet - low sodium heart healthy   Complete by: As directed    Increase activity slowly   Complete by: As directed    No wound care   Complete by: As directed       Allergies as of 09/14/2021   No Known Allergies      Medication List     STOP taking these medications    ciprofloxacin 500 MG tablet Commonly known as: CIPRO   loratadine 10 MG tablet Commonly known as: CLARITIN   metroNIDAZOLE 500 MG tablet Commonly known as: FLAGYL   OVER THE COUNTER MEDICATION       TAKE these medications    acetaminophen 500 MG tablet Commonly known as: TYLENOL Take 500 mg by mouth every 6 (six) hours as needed.   amoxicillin-clavulanate 875-125 MG tablet Commonly known as: Augmentin Take 1 tablet by mouth 2 (two) times daily for 7 days.   levothyroxine 50 MCG tablet Commonly known as: SYNTHROID Take 50 mcg by mouth daily.   montelukast 10 MG tablet Commonly known as: SINGULAIR Take 10 mg by mouth daily as needed.   oxyCODONE 5 MG immediate release tablet Commonly known as: Oxy IR/ROXICODONE Take 1 tablet (5 mg total) by mouth every 4 (four) hours as needed for severe pain or breakthrough pain.   rosuvastatin 10 MG tablet Commonly known as: CRESTOR Take 10 mg by mouth daily.   Vitamin D (Ergocalciferol) 1.25 MG (50000 UNIT) Caps capsule Commonly known as: DRISDOL Take 50,000  Units by mouth every 7 (seven) days.               Durable Medical Equipment  (From admission, onward)           Start     Ordered   09/13/21 1307  For home use only DME Walker rolling  Once       Question Answer Comment  Walker: With 5 Inch Wheels   Patient needs a walker to treat with the following condition Weakness      09/13/21 1306    09/13/21 1307  For home use only DME 3 n 1  Once        09/13/21 1306            Follow-up [facility] Follow up in 1 week(s).   Specialty: General Surgery Why: new colostomy this isnt an appointment it just the place they had surgery Contact information: [address] [id number] [place]        Ronny Bacon, MD. Schedule an appointment as soon as possible for a visit in 1 week(s).   Specialty: General Surgery Why: s/p colectomy and colostomy creation, has staples Contact information: [address] [place] [address] [phone]         Crist Infante, MD. Go on 09/29/2021.   Specialty: Internal Medicine Why: 9:15 am appointment Contact information: [address] [place] [address] [phone]                No Known Allergies  Consultations: General surgery   Procedures/Studies: CT Abdomen Pelvis Wo Contrast  Result Date: 09/06/2021 CLINICAL DATA:  Upper and lower abdominal pain. EXAM: CT ABDOMEN AND PELVIS WITHOUT CONTRAST TECHNIQUE: Multidetector CT imaging of the abdomen and pelvis was performed following the standard protocol without IV contrast. COMPARISON:  November 16, 2010 FINDINGS: Lower chest: No acute abnormality. Hepatobiliary: No focal liver abnormality is seen. No gallstones, gallbladder wall thickening, or biliary dilatation. Pancreas: Unremarkable. No pancreatic ductal dilatation or surrounding inflammatory changes. Spleen: Normal in size without focal abnormality. Adrenals/Urinary Tract: Adrenal glands are unremarkable. Kidneys are normal in size, without renal calculi or hydronephrosis. A 9 mm cystic appearing areas seen within the anterolateral aspect of the mid right kidney. A the urinary bladder is empty and subsequently limited in evaluation. Stomach/Bowel: Stomach is within normal limits. Appendix appears normal. The small  bowel loops are normal in caliber. Moderate to markedly distended large bowel loops are seen from the level of the cecum to the proximal sigmoid colon. An abrupt transition zone is seen at this level (axial CT images 75 through 78, CT series 2). A segment of moderately thickened proximal sigmoid colon is seen at this level. Numerous diverticula are also seen throughout the sigmoid colon. Vascular/Lymphatic: No significant vascular findings are present. No enlarged abdominal or pelvic lymph nodes. Reproductive: Status post hysterectomy. No adnexal masses. Other: No abdominal wall hernia or abnormality. No abdominopelvic ascites. Musculoskeletal: No acute or significant osseous findings. IMPRESSION: 1. Moderate severity colitis and/or diverticulitis within the proximal sigmoid colon with secondary distal partial large bowel obstruction. Follow-up to resolution is recommended to exclude the presence of an obstructing mass lesion within the sigmoid colon. Electronically Signed   By: Virgina Norfolk M.D.   On: 09/06/2021 23:31   DG BE (COLON)W SINGLE CM (SOL OR THIN BA)  Result Date: 09/07/2021 CLINICAL DATA:  Large bowel obstruction EXAM: SINGLE CONTRAST BARIUM  ENEMA TECHNIQUE: Initial scout AP supine abdominal image obtained to insure adequate colon cleansing. Barium was introduced into the colon in a retrograde fashion and refluxed from the rectum to the sigmoid colon. Spot images of the colon followed by overhead radiographs were obtained. FLUOROSCOPY TIME:  Fluoroscopy Time:  54 seconds Radiation Exposure Index (if provided by the fluoroscopic device): 36.10 mGy COMPARISON:  Correlation made with prior CT FINDINGS: Scout radiographs of the abdomen demonstrate dilated, air-filled colon to the level of the sigmoid. There is an area of circumferential narrowing of the sigmoid colon measuring approximately 4.6 cm in length maximal. Lumen is focally narrowed to 5 mm. Contrast does pass through this area into the  more proximal sigmoid. Scattered diverticula noted. IMPRESSION: Segmental circumferential narrowing of the sigmoid colon as described with proximal colonic dilatation. Scattered diverticula noted. There are no substantial acute inflammatory changes on the prior CT and tissue sampling is recommended when possible to evaluate for malignancy. Electronically Signed   By: Macy Mis M.D.   On: 09/07/2021 11:22      Subjective: Seen and examined at time of discharge.  Pain well controlled.  Ostomy education performed.  Stable for discharge home.  Discharge Exam: Vitals:   09/14/21 0452 09/14/21 0750  BP: 119/71 114/63  Pulse:  64  Resp: 20 18  Temp: 98.6 F (37 C) 98.2 F (36.8 C)  SpO2: 99% 96%   Vitals:   09/13/21 1538 09/13/21 1934 09/14/21 0452 09/14/21 0750  BP: 118/60 (!) 116/52 119/71 114/63  Pulse: 71 69  64  Resp: 20 20 20 18   Temp: 98.3 F (36.8 C) 98.6 F (37 C) 98.6 F (37 C) 98.2 F (36.8 C)  TempSrc: Oral Oral Oral Oral  SpO2: 99% 99% 99% 96%  Weight:      Height:        General: Pt is alert, awake, not in acute distress Cardiovascular: RRR, S1/S2 +, no rubs, no gallops Respiratory: CTA bilaterally, no wheezing, no rhonchi Abdominal: Soft, nondistended, soft stool in ostomy.  Positive bowel sounds Extremities: no edema, no cyanosis    The results of significant diagnostics from this hospitalization (including imaging, microbiology, ancillary and laboratory) are listed below for reference.     Microbiology: Recent Results (from the past 240 hour(s))  Resp Panel by RT-PCR (Flu A&B, Covid) Nasopharyngeal Swab     Status: None   Collection Time: 09/07/21 12:52 AM   Specimen: Nasopharyngeal Swab; Nasopharyngeal(NP) swabs in vial transport medium  Result Value Ref Range Status   SARS Coronavirus 2 by RT PCR NEGATIVE NEGATIVE Final    Comment: (NOTE) SARS-CoV-2 target nucleic acids are NOT DETECTED.  The SARS-CoV-2 RNA is generally detectable in upper  respiratory specimens during the acute phase of infection. The lowest concentration of SARS-CoV-2 viral copies this assay can detect is 138 copies/mL. A negative result does not preclude SARS-Cov-2 infection and should not be used as the sole basis for treatment or other patient management decisions. A negative result may occur with  improper specimen collection/handling, submission of specimen other than nasopharyngeal swab, presence of viral mutation(s) within the areas targeted by this assay, and inadequate number of viral copies(<138 copies/mL). A negative result must be combined with clinical observations, patient history, and epidemiological information. The expected result is Negative.  Fact Sheet for Patients:  EntrepreneurPulse.com.au  Fact Sheet for Healthcare Providers:  IncredibleEmployment.be  This test is no t yet approved or cleared by the [place] and  has been authorized  for detection and/or diagnosis of SARS-CoV-2 by FDA under an Emergency Use Authorization (EUA). This EUA will remain  in effect (meaning this test can be used) for the duration of the COVID-19 declaration under Section 564(b)(1) of the Act, 21 U.S.C.section 360bbb-3(b)(1), unless the authorization is terminated  or revoked sooner.       Influenza A by PCR NEGATIVE NEGATIVE Final   Influenza B by PCR NEGATIVE NEGATIVE Final    Comment: (NOTE) The Xpert Xpress SARS-CoV-2/FLU/RSV plus assay is intended as an aid in the diagnosis of influenza from Nasopharyngeal swab specimens and should not be used as a sole basis for treatment. Nasal washings and aspirates are unacceptable for Xpert Xpress SARS-CoV-2/FLU/RSV testing.  Fact Sheet for Patients: EntrepreneurPulse.com.au  Fact Sheet for Healthcare Providers: IncredibleEmployment.be  This test is not yet approved or cleared by the [place] FDA and has been  authorized for detection and/or diagnosis of SARS-CoV-2 by FDA under an Emergency Use Authorization (EUA). This EUA will remain in effect (meaning this test can be used) for the duration of the COVID-19 declaration under Section 564(b)(1) of the Act, 21 U.S.C. section 360bbb-3(b)(1), unless the authorization is terminated or revoked.  Performed at [facility], [address] [place], [place] [address]      Labs: BNP (last 3 results) No results for input(s): BNP in the last 8760 hours. Basic Metabolic Panel: Recent Labs  Lab 09/10/21 0459 09/11/21 0453 09/12/21 0542 09/13/21 0622 09/14/21 0418  NA 133* 134* 134* 135 136  K 5.0 4.3 4.0 3.6 3.3*  CL 99 99 101 105 103  CO2 27 25 26 26 24   GLUCOSE 135* 140* 94 92 98  BUN 22 20 21 15 10   CREATININE 1.49* 1.37* 1.19* 1.04* 0.89  CALCIUM 8.0* 8.5* 8.0* 7.9* 8.0*  MG 2.2 2.5* 2.5* 2.1 1.9   Liver Function Tests: No results for input(s): AST, ALT, ALKPHOS, BILITOT, PROT, ALBUMIN in the last 168 hours. No results for input(s): LIPASE, AMYLASE in the last 168 hours. No results for input(s): AMMONIA in the last 168 hours. CBC: Recent Labs  Lab 09/10/21 0459 09/11/21 0453 09/12/21 0542 09/13/21 0622 09/14/21 0418  WBC 13.9* 17.1* 13.9* 10.8* 10.9*  HGB 13.9 12.8 11.3* 11.3* 11.7*  HCT 39.3 37.2 34.6* 32.9* 33.2*  MCV 99.5 99.5 98.9 100.0 98.2  PLT 240 233 223 241 278   Cardiac Enzymes: No results for input(s): CKTOTAL, CKMB, CKMBINDEX, TROPONINI in the last 168 hours. BNP: Invalid input(s): POCBNP CBG: Recent Labs  Lab 09/07/21 1620  GLUCAP 121*   D-Dimer No results for input(s): DDIMER in the last 72 hours. Hgb A1c No results for input(s): HGBA1C in the last 72 hours. Lipid Profile No results for input(s): CHOL, HDL, LDLCALC, TRIG, CHOLHDL, LDLDIRECT in the last 72 hours. Thyroid function studies No results for input(s): TSH, T4TOTAL, T3FREE, THYROIDAB in the last 72 hours.  Invalid input(s):  FREET3 Anemia work up No results for input(s): VITAMINB12, FOLATE, FERRITIN, TIBC, IRON, RETICCTPCT in the last 72 hours. Urinalysis    Component Value Date/Time   COLORURINE AMBER (A) 09/06/2021 1927   APPEARANCEUR HAZY (A) 09/06/2021 1927   LABSPEC 1.034 (H) 09/06/2021 1927   PHURINE 5.0 09/06/2021 1927   GLUCOSEU NEGATIVE 09/06/2021 1927   HGBUR NEGATIVE 09/06/2021 1927   BILIRUBINUR MODERATE (A) 09/06/2021 1927   KETONESUR 20 (A) 09/06/2021 1927   PROTEINUR 100 (A) 09/06/2021 1927   NITRITE NEGATIVE 09/06/2021 1927   LEUKOCYTESUR SMALL (A) 09/06/2021 1927  Sepsis Labs Invalid input(s): PROCALCITONIN,  WBC,  LACTICIDVEN Microbiology Recent Results (from the past 240 hour(s))  Resp Panel by RT-PCR (Flu A&B, Covid) Nasopharyngeal Swab     Status: None   Collection Time: 09/07/21 12:52 AM   Specimen: Nasopharyngeal Swab; Nasopharyngeal(NP) swabs in vial transport medium  Result Value Ref Range Status   SARS Coronavirus 2 by RT PCR NEGATIVE NEGATIVE Final    Comment: (NOTE) SARS-CoV-2 target nucleic acids are NOT DETECTED.  The SARS-CoV-2 RNA is generally detectable in upper respiratory specimens during the acute phase of infection. The lowest concentration of SARS-CoV-2 viral copies this assay can detect is 138 copies/mL. A negative result does not preclude SARS-Cov-2 infection and should not be used as the sole basis for treatment or other patient management decisions. A negative result may occur with  improper specimen collection/handling, submission of specimen other than nasopharyngeal swab, presence of viral mutation(s) within the areas targeted by this assay, and inadequate number of viral copies(<138 copies/mL). A negative result must be combined with clinical observations, patient history, and epidemiological information. The expected result is Negative.  Fact Sheet for Patients:  EntrepreneurPulse.com.au  Fact Sheet for Healthcare Providers:   IncredibleEmployment.be  This test is no t yet approved or cleared by the [place] FDA and  has been authorized for detection and/or diagnosis of SARS-CoV-2 by FDA under an Emergency Use Authorization (EUA). This EUA will remain  in effect (meaning this test can be used) for the duration of the COVID-19 declaration under Section 564(b)(1) of the Act, 21 U.S.C.section 360bbb-3(b)(1), unless the authorization is terminated  or revoked sooner.       Influenza A by PCR NEGATIVE NEGATIVE Final   Influenza B by PCR NEGATIVE NEGATIVE Final    Comment: (NOTE) The Xpert Xpress SARS-CoV-2/FLU/RSV plus assay is intended as an aid in the diagnosis of influenza from Nasopharyngeal swab specimens and should not be used as a sole basis for treatment. Nasal washings and aspirates are unacceptable for Xpert Xpress SARS-CoV-2/FLU/RSV testing.  Fact Sheet for Patients: EntrepreneurPulse.com.au  Fact Sheet for Healthcare Providers: IncredibleEmployment.be  This test is not yet approved or cleared by the [place] FDA and has been authorized for detection and/or diagnosis of SARS-CoV-2 by FDA under an Emergency Use Authorization (EUA). This EUA will remain in effect (meaning this test can be used) for the duration of the COVID-19 declaration under Section 564(b)(1) of the Act, 21 U.S.C. section 360bbb-3(b)(1), unless the authorization is terminated or revoked.  Performed at [facility], [address], [place], [place] [address]      Time coordinating discharge: Over 30 minutes  SIGNED:   Sidney Ace, MD  [facility] 09/14/2021, 3:41 PM Pager   If 7PM-7AM, please contact night-coverage

## 2021-09-14 NOTE — Discharge Instructions (Signed)
In addition to included general post-operative instructions,  Diet: Resume home diet.   Activity: No heavy lifting >20 pounds (children, pets, laundry, garbage) or strenuous activity for 6 weeks, but light activity and walking are encouraged. Do not drive or drink alcohol if taking narcotic pain medications or having pain that might distract from driving.  Wound care: You may shower/get incision wet with soapy water and pat dry (do not rub incisions), but no baths or submerging incision underwater until follow-up. We will remove staples in follow up   Medications: Resume all home medications. For mild to moderate pain: acetaminophen (Tylenol) or ibuprofen/naproxen (if no kidney disease). Combining Tylenol with alcohol can substantially increase your risk of causing liver disease. Narcotic pain medications, if prescribed, can be used for severe pain, though may cause nausea, constipation, and drowsiness. Do not combine Tylenol and Percocet (or similar) within a 6 hour period as Percocet (and similar) contain(s) Tylenol. If you do not need the narcotic pain medication, you do not need to fill the prescription.  Call office [phone] / [phone]) at any time if any questions, worsening pain, fevers/chills, bleeding, drainage from incision site, or other concerns.

## 2021-09-14 NOTE — Progress Notes (Signed)
Patient discharged from unit via wheelchair, accompanied by spouse.  Patient discharged with discharge instructions, prescriptions, and personal belongings.  Patient able to teach back discharge instructions and ostomy care. Patient stated she would like to make ostomy clinic appointment herself.  IV site d/ced.  No acute distress noted. Verified with primary nurse that patient had no other needs prior to discharge. Care relinquished.

## 2021-09-14 NOTE — Progress Notes (Addendum)
[place] Hospital Day(s): 7.   Post op day(s): 5 Days Post-Op.   Interval History:  Patient seen and examined No acute events or new complaints overnight.  Patient reports she is tired but overall feeling better No fever, chills, nausea, emesis  Leukocytosis has nearly resolved this morning; 10.9K Renal function normalized; sCr - 0.89; UO - unmeasured Mild hypokalemia to 3.3 Colostomy output recorded at 100 ccs She is on soft diet; tolerating well Worked with therapies; recommending HHPT  Vital signs in last 24 hours: [min-max] current  Temp:  [98.2 F (36.8 C)-98.6 F (37 C)] 98.6 F (37 C) (10/19 0452) Pulse Rate:  [69-71] 69 (10/18 1934) Resp:  [20] 20 (10/19 0452) BP: (116-129)/(52-71) 119/71 (10/19 0452) SpO2:  [97 %-99 %] 99 % (10/19 0452)     Height: 5\' 6"  (167.6 cm) Weight: 98 kg BMI (Calculated): 34.88   Intake/Output last 2 shifts:  10/18 0701 - 10/19 0700 In: 240 [P.O.:240] Out: 450 [Urine:350; Stool:100]   Physical Exam:  Constitutional: alert, cooperative and no distress  Respiratory: breathing non-labored at rest  Cardiovascular: regular rate and sinus rhythm  Gastrointestinal: Soft, incisional soreness, non-distended, no rebound/guarding. Colostomy in the left mid abdomen, patent, significant gas and stool in bag  Integumentary: Laparotomy is CDI with loosely approximated staples, wicks in between (removed these), minimal expected erythema, expected serosanguinous drainage. I probed the wound which did not reveal any undrained fluid collections, fascia intact, dressing replaced    Labs:  CBC Latest Ref Rng & Units 09/14/2021 09/13/2021 09/12/2021  WBC 4.0 - 10.5 K/uL 10.9(H) 10.8(H) 13.9(H)  Hemoglobin 12.0 - 15.0 g/dL 11.7(L) 11.3(L) 11.3(L)  Hematocrit 36.0 - 46.0 % 33.2(L) 32.9(L) 34.6(L)  Platelets 150 - 400 K/uL 278 241 223   CMP Latest Ref Rng & Units 09/14/2021 09/13/2021 09/12/2021  Glucose 70 - 99  mg/dL 98 92 94  BUN 8 - 23 mg/dL 10 15 21   Creatinine 0.44 - 1.00 mg/dL 0.89 1.04(H) 1.19(H)  Sodium 135 - 145 mmol/L 136 135 134(L)  Potassium 3.5 - 5.1 mmol/L 3.3(L) 3.6 4.0  Chloride 98 - 111 mmol/L 103 105 101  CO2 22 - 32 mmol/L 24 26 26   Calcium 8.9 - 10.3 mg/dL 8.0(L) 7.9(L) 8.0(L)  Total Protein 6.5 - 8.1 g/dL - - -  Total Bilirubin 0.3 - 1.2 mg/dL - - -  Alkaline Phos 38 - 126 U/L - - -  AST 15 - 41 U/L - - -  ALT 0 - 44 U/L - - -     Imaging studies: No new pertinent imaging studies   Assessment/Plan:  70 y.o. female 5 Days Post-Op s/p Hartmann's Procedure for large bowel obstruction   - Continue soft diet  - Continue IV Abx (Zosyn) for now (Day 7); I will send her with 7 days Augmentin for home to cover any potential for wound infection given stool spillage intra-operatively - Monitor abdominal examination; on-going colostomy function - Monitor ostomy output; may need imodium at home to thicken stool, its very loose today  - Pain control prn; antiemetics prn   - Appreciate WOC RN assistance   - Mobilization encouraged; therapy on board; recommending HHPT; I did order this yesterday  - Further management per primary service; we will follow     - Discharge Planning; Okay for discharge home today, follow up in 1 week with Dr Christian Mate, home health RN/PT arrange with [facility] team  All of the above findings and recommendations were  discussed with the patient, and the medical team, and all of patient's questions were answered to her expressed satisfaction.  -- Edison Simon, PA-C [facility] 09/14/2021, 7:42 AM [phone] M-F: 7am - 4pm

## 2021-09-14 NOTE — TOC Transition Note (Signed)
Transition of Care [facility]) - CM/SW Discharge Note   Patient Details  Name: ALLAHNA HUSBAND MRN: [id number] Date of Birth: [birth year]  [facility] CM/SW Contact:  Beverly Sessions, RN Phone Number: 09/14/2021, 12:39 PM   Clinical Narrative:     Patient to discharge to home DME has been delivered to room Family to transport [place] with [place] notified of discharge   Final next level of care: [place] Barriers to Discharge: Barriers Resolved   Patient Goals and CMS Choice     Choice offered to / list presented to : Patient  Discharge Placement                       Discharge Plan and Services   Discharge Planning Services: CM Consult            DME Arranged: Gilford Rile rolling, 3-N-1 DME Agency: [employer] Date DME Agency Contacted: 09/13/21   Representative spoke with at DME Agency: Faythe Dingwall HH Arranged: RN, PT [facility] Agency: [place] ([place]) Date [place]: 09/13/21   Representative spoke with at [place]: [place] ([place]) Interventions     Readmission Risk Interventions No flowsheet data found.

## 2021-09-17 DIAGNOSIS — K529 Noninfective gastroenteritis and colitis, unspecified: Secondary | ICD-10-CM | POA: Diagnosis not present

## 2021-09-17 DIAGNOSIS — E039 Hypothyroidism, unspecified: Secondary | ICD-10-CM | POA: Diagnosis not present

## 2021-09-17 DIAGNOSIS — Z48815 Encounter for surgical aftercare following surgery on the digestive system: Secondary | ICD-10-CM | POA: Diagnosis not present

## 2021-09-17 DIAGNOSIS — G4700 Insomnia, unspecified: Secondary | ICD-10-CM | POA: Diagnosis not present

## 2021-09-17 DIAGNOSIS — Z9181 History of falling: Secondary | ICD-10-CM | POA: Diagnosis not present

## 2021-09-17 DIAGNOSIS — E785 Hyperlipidemia, unspecified: Secondary | ICD-10-CM | POA: Diagnosis not present

## 2021-09-17 DIAGNOSIS — K5732 Diverticulitis of large intestine without perforation or abscess without bleeding: Secondary | ICD-10-CM | POA: Diagnosis not present

## 2021-09-17 DIAGNOSIS — Z433 Encounter for attention to colostomy: Secondary | ICD-10-CM | POA: Diagnosis not present

## 2021-09-17 DIAGNOSIS — K5792 Diverticulitis of intestine, part unspecified, without perforation or abscess without bleeding: Secondary | ICD-10-CM | POA: Diagnosis not present

## 2021-09-17 DIAGNOSIS — Z4801 Encounter for change or removal of surgical wound dressing: Secondary | ICD-10-CM | POA: Diagnosis not present

## 2021-09-17 DIAGNOSIS — K6389 Other specified diseases of intestine: Secondary | ICD-10-CM | POA: Diagnosis not present

## 2021-09-17 DIAGNOSIS — Z86010 Personal history of colonic polyps: Secondary | ICD-10-CM | POA: Diagnosis not present

## 2021-09-23 DIAGNOSIS — Z4801 Encounter for change or removal of surgical wound dressing: Secondary | ICD-10-CM | POA: Diagnosis not present

## 2021-09-23 DIAGNOSIS — Z9181 History of falling: Secondary | ICD-10-CM | POA: Diagnosis not present

## 2021-09-23 DIAGNOSIS — K6389 Other specified diseases of intestine: Secondary | ICD-10-CM | POA: Diagnosis not present

## 2021-09-23 DIAGNOSIS — E785 Hyperlipidemia, unspecified: Secondary | ICD-10-CM | POA: Diagnosis not present

## 2021-09-23 DIAGNOSIS — Z86010 Personal history of colonic polyps: Secondary | ICD-10-CM | POA: Diagnosis not present

## 2021-09-23 DIAGNOSIS — E039 Hypothyroidism, unspecified: Secondary | ICD-10-CM | POA: Diagnosis not present

## 2021-09-23 DIAGNOSIS — K5732 Diverticulitis of large intestine without perforation or abscess without bleeding: Secondary | ICD-10-CM | POA: Diagnosis not present

## 2021-09-23 DIAGNOSIS — K529 Noninfective gastroenteritis and colitis, unspecified: Secondary | ICD-10-CM | POA: Diagnosis not present

## 2021-09-23 DIAGNOSIS — K5792 Diverticulitis of intestine, part unspecified, without perforation or abscess without bleeding: Secondary | ICD-10-CM | POA: Diagnosis not present

## 2021-09-23 DIAGNOSIS — Z48815 Encounter for surgical aftercare following surgery on the digestive system: Secondary | ICD-10-CM | POA: Diagnosis not present

## 2021-09-23 DIAGNOSIS — Z433 Encounter for attention to colostomy: Secondary | ICD-10-CM | POA: Diagnosis not present

## 2021-09-23 DIAGNOSIS — G4700 Insomnia, unspecified: Secondary | ICD-10-CM | POA: Diagnosis not present

## 2021-09-28 DIAGNOSIS — Z48815 Encounter for surgical aftercare following surgery on the digestive system: Secondary | ICD-10-CM | POA: Diagnosis not present

## 2021-09-28 DIAGNOSIS — Z433 Encounter for attention to colostomy: Secondary | ICD-10-CM | POA: Diagnosis not present

## 2021-09-28 DIAGNOSIS — K6389 Other specified diseases of intestine: Secondary | ICD-10-CM | POA: Diagnosis not present

## 2021-09-28 DIAGNOSIS — Z86010 Personal history of colonic polyps: Secondary | ICD-10-CM | POA: Diagnosis not present

## 2021-09-28 DIAGNOSIS — K529 Noninfective gastroenteritis and colitis, unspecified: Secondary | ICD-10-CM | POA: Diagnosis not present

## 2021-09-28 DIAGNOSIS — G4700 Insomnia, unspecified: Secondary | ICD-10-CM | POA: Diagnosis not present

## 2021-09-28 DIAGNOSIS — Z9181 History of falling: Secondary | ICD-10-CM | POA: Diagnosis not present

## 2021-09-28 DIAGNOSIS — K5792 Diverticulitis of intestine, part unspecified, without perforation or abscess without bleeding: Secondary | ICD-10-CM | POA: Diagnosis not present

## 2021-09-28 DIAGNOSIS — K5732 Diverticulitis of large intestine without perforation or abscess without bleeding: Secondary | ICD-10-CM | POA: Diagnosis not present

## 2021-09-28 DIAGNOSIS — Z4801 Encounter for change or removal of surgical wound dressing: Secondary | ICD-10-CM | POA: Diagnosis not present

## 2021-09-28 DIAGNOSIS — E785 Hyperlipidemia, unspecified: Secondary | ICD-10-CM | POA: Diagnosis not present

## 2021-09-28 DIAGNOSIS — E039 Hypothyroidism, unspecified: Secondary | ICD-10-CM | POA: Diagnosis not present

## 2021-09-29 ENCOUNTER — Encounter: Payer: Self-pay | Admitting: Surgery

## 2021-09-29 ENCOUNTER — Other Ambulatory Visit: Payer: Self-pay

## 2021-09-29 ENCOUNTER — Telehealth: Payer: Self-pay

## 2021-09-29 ENCOUNTER — Ambulatory Visit (INDEPENDENT_AMBULATORY_CARE_PROVIDER_SITE_OTHER): Payer: PPO | Admitting: Surgery

## 2021-09-29 VITALS — BP 122/82 | HR 85 | Temp 97.8°F | Ht 65.5 in | Wt 205.0 lb

## 2021-09-29 DIAGNOSIS — L919 Hypertrophic disorder of the skin, unspecified: Secondary | ICD-10-CM | POA: Insufficient documentation

## 2021-09-29 DIAGNOSIS — I809 Phlebitis and thrombophlebitis of unspecified site: Secondary | ICD-10-CM

## 2021-09-29 DIAGNOSIS — Z933 Colostomy status: Secondary | ICD-10-CM

## 2021-09-29 DIAGNOSIS — I700 Atherosclerosis of aorta: Secondary | ICD-10-CM | POA: Insufficient documentation

## 2021-09-29 MED ORDER — RIVAROXABAN (XARELTO) VTE STARTER PACK (15 & 20 MG): ORAL_TABLET | ORAL | 0 refills | Status: DC

## 2021-09-29 NOTE — Telephone Encounter (Signed)
Spoke with patient to let her know I have spoke with [facility] and they have started the process for her supplies to be delivered. I ask patient to call office and let us know when her supplies have been delivered. Some supplies were provided to patient today in the office.

## 2021-09-29 NOTE — Progress Notes (Signed)
[facility] POST-OP OFFICE VISIT  09/29/2021  HPI: Abigail Mitchell is a 70 y.o. female 20 days s/p Hartman's procedure for obstructing stenotic diverticular disease.  She continues to have some persistent drainage from the inferior most aspect of her incision.  She denies fevers and chills.  She reports her stoma has diminished in size which was anticipated.  The stool quality is improved to a point of normalcy for colostomy function.  She has 2 remaining appliances and we are looking to help bridge her until her regular supply can be provided.  She otherwise reports her appetite is improving, but remains weak/fatigued. She reports a prior history of superficial thrombophlebitis and points out to me in area of her left medial thigh overlying what I would expect to be her saphenous vein site, as palpable subcutaneous cordlike lesions.  She reports it significantly improved over the last several days.  And wanted me to be aware of it.  There is no calf pain there is no unilateral swelling or edema involving either leg.  Nothing to warrant suspicion of DVT.  She has a previous history of being treated with Xarelto.  Vital signs: There were no vitals taken for this visit.   Physical Exam: Constitutional: She appears quite well, compared to our last evaluation in the hospital. Abdomen: Soft benign nontender.  Stomal appliance fitting well the left. Skin: Staples all were removed.  There was adequate reaction around the staples to indicate reasonable healing at this point.  The inferior most apex of her incision is draining and I probed it with a silver nitrate stick and a subsequent Q-tip.  There is a deeper cavitation that I feel should be drained better with a wick, and 1/4 inch packing strip was placed to that depth.  I instructed her and her husband that this should be done daily.  Assessment/Plan: This is a 70 y.o. female 20 days s/p Hartman's procedure.  Patient Active Problem List    Diagnosis Date Noted   Hardening of the aorta (main artery of the heart) ([place]) 09/29/2021   Hypertrophic condition of skin 09/29/2021   Acute diverticulitis 09/07/2021   Pain in left leg 12/15/2019   Thrombophlebitis of superficial veins of lower extremity 12/15/2019   Hyperlipidemia 05/14/2019   Chronic kidney disease, stage 3a ([place]) 12/29/2016   Hypothyroidism 12/29/2016   Phlebitis and thrombophlebitis 12/29/2016   Nutritional anemia 10/04/2016   Diverticular disease of colon 10/03/2016   Right lower quadrant pain 05/17/2016   Dysuria 05/15/2016   Acute pharyngitis 02/15/2016   Fever 02/15/2016   Pain 02/15/2016   Streptococcal pharyngitis 02/15/2016   Acquired absence of ovaries, 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   Disorder of bone 12/21/2015   Encounter for general adult medical examination without abnormal findings 12/02/2015   Trigeminal neuralgia 09/07/2015   Benign paroxysmal positional vertigo 05/24/2015   Obesity 07/31/2014   Herpes zoster without complication 93/57/0177   Abnormal results of thyroid function studies 07/20/2011   Vitamin D deficiency 09/15/2010   Sleep apnea 09/14/2010   Abnormal uterine bleeding 07/26/2010   Benign neoplasm of colon 07/26/2010   Snoring 07/26/2010   Chronic migraine without aura, not intractable, without status migrainosus 07/18/2010   Hx of colonic polyps - diminutive adenomas 10/12/2006    -We will have him continue the wicking changes daily anticipating diminished drainage from that site.  No other evidence of infection.  We will have him follow-up in 3 weeks to follow-up on progress of her wound. We will plan  on a 45-day course of Xarelto and follow-up with imaging after treatment.   Ronny Bacon M.D., FACS 09/29/2021, 9:04 AM

## 2021-09-29 NOTE — Patient Instructions (Signed)
Please remove old packing strip from wound daily. Repack using new packing strip. Cover area with dry dressing. Please see your follow up appointment listed below.

## 2021-10-03 DIAGNOSIS — K5792 Diverticulitis of intestine, part unspecified, without perforation or abscess without bleeding: Secondary | ICD-10-CM | POA: Diagnosis not present

## 2021-10-03 DIAGNOSIS — Z933 Colostomy status: Secondary | ICD-10-CM | POA: Diagnosis not present

## 2021-10-03 DIAGNOSIS — K56609 Unspecified intestinal obstruction, unspecified as to partial versus complete obstruction: Secondary | ICD-10-CM | POA: Diagnosis not present

## 2021-10-03 DIAGNOSIS — I8002 Phlebitis and thrombophlebitis of superficial vessels of left lower extremity: Secondary | ICD-10-CM | POA: Diagnosis not present

## 2021-10-06 DIAGNOSIS — K6389 Other specified diseases of intestine: Secondary | ICD-10-CM | POA: Diagnosis not present

## 2021-10-06 DIAGNOSIS — K529 Noninfective gastroenteritis and colitis, unspecified: Secondary | ICD-10-CM | POA: Diagnosis not present

## 2021-10-06 DIAGNOSIS — Z86010 Personal history of colonic polyps: Secondary | ICD-10-CM | POA: Diagnosis not present

## 2021-10-06 DIAGNOSIS — K5732 Diverticulitis of large intestine without perforation or abscess without bleeding: Secondary | ICD-10-CM | POA: Diagnosis not present

## 2021-10-06 DIAGNOSIS — K5792 Diverticulitis of intestine, part unspecified, without perforation or abscess without bleeding: Secondary | ICD-10-CM | POA: Diagnosis not present

## 2021-10-06 DIAGNOSIS — Z4801 Encounter for change or removal of surgical wound dressing: Secondary | ICD-10-CM | POA: Diagnosis not present

## 2021-10-06 DIAGNOSIS — E785 Hyperlipidemia, unspecified: Secondary | ICD-10-CM | POA: Diagnosis not present

## 2021-10-06 DIAGNOSIS — E039 Hypothyroidism, unspecified: Secondary | ICD-10-CM | POA: Diagnosis not present

## 2021-10-06 DIAGNOSIS — G4700 Insomnia, unspecified: Secondary | ICD-10-CM | POA: Diagnosis not present

## 2021-10-06 DIAGNOSIS — Z48815 Encounter for surgical aftercare following surgery on the digestive system: Secondary | ICD-10-CM | POA: Diagnosis not present

## 2021-10-06 DIAGNOSIS — Z433 Encounter for attention to colostomy: Secondary | ICD-10-CM | POA: Diagnosis not present

## 2021-10-06 DIAGNOSIS — Z9181 History of falling: Secondary | ICD-10-CM | POA: Diagnosis not present

## 2021-10-07 DIAGNOSIS — K5732 Diverticulitis of large intestine without perforation or abscess without bleeding: Secondary | ICD-10-CM | POA: Diagnosis not present

## 2021-10-07 DIAGNOSIS — Z933 Colostomy status: Secondary | ICD-10-CM | POA: Diagnosis not present

## 2021-10-13 DIAGNOSIS — Z86010 Personal history of colonic polyps: Secondary | ICD-10-CM | POA: Diagnosis not present

## 2021-10-13 DIAGNOSIS — E039 Hypothyroidism, unspecified: Secondary | ICD-10-CM | POA: Diagnosis not present

## 2021-10-13 DIAGNOSIS — K529 Noninfective gastroenteritis and colitis, unspecified: Secondary | ICD-10-CM | POA: Diagnosis not present

## 2021-10-13 DIAGNOSIS — E785 Hyperlipidemia, unspecified: Secondary | ICD-10-CM | POA: Diagnosis not present

## 2021-10-13 DIAGNOSIS — K5732 Diverticulitis of large intestine without perforation or abscess without bleeding: Secondary | ICD-10-CM | POA: Diagnosis not present

## 2021-10-13 DIAGNOSIS — K6389 Other specified diseases of intestine: Secondary | ICD-10-CM | POA: Diagnosis not present

## 2021-10-13 DIAGNOSIS — G4700 Insomnia, unspecified: Secondary | ICD-10-CM | POA: Diagnosis not present

## 2021-10-13 DIAGNOSIS — Z48815 Encounter for surgical aftercare following surgery on the digestive system: Secondary | ICD-10-CM | POA: Diagnosis not present

## 2021-10-13 DIAGNOSIS — Z4801 Encounter for change or removal of surgical wound dressing: Secondary | ICD-10-CM | POA: Diagnosis not present

## 2021-10-13 DIAGNOSIS — Z9181 History of falling: Secondary | ICD-10-CM | POA: Diagnosis not present

## 2021-10-13 DIAGNOSIS — Z433 Encounter for attention to colostomy: Secondary | ICD-10-CM | POA: Diagnosis not present

## 2021-10-13 DIAGNOSIS — K5792 Diverticulitis of intestine, part unspecified, without perforation or abscess without bleeding: Secondary | ICD-10-CM | POA: Diagnosis not present

## 2021-10-18 ENCOUNTER — Ambulatory Visit (INDEPENDENT_AMBULATORY_CARE_PROVIDER_SITE_OTHER): Payer: PPO | Admitting: Surgery

## 2021-10-18 ENCOUNTER — Encounter: Payer: Self-pay | Admitting: Surgery

## 2021-10-18 ENCOUNTER — Other Ambulatory Visit: Payer: Self-pay

## 2021-10-18 VITALS — BP 135/65 | HR 65 | Temp 98.2°F | Ht 65.5 in | Wt 206.0 lb

## 2021-10-18 DIAGNOSIS — Z933 Colostomy status: Secondary | ICD-10-CM

## 2021-10-18 NOTE — Patient Instructions (Signed)
We will have you follow up in 6 weeks.  Continue to pack the area 1-2 times a day. Once you get to the point you cannot pack anymore just use a Q-tip to keep it from closing too soon.  You may return to work. No heavy lifting for one more week.

## 2021-10-18 NOTE — Progress Notes (Signed)
[facility]GICAL [facility] POST-OP OFFICE VISIT  10/18/2021  HPI: Abigail Mitchell is a 70 y.o. female 5 weeks s/p Hartman's procedure for obstruction, benign obstruction secondary to diverticulitis.  She continues to make excellent progress, denies any pain, mobilizes very well, has good colostomy function and appetite.  Denies any fevers.  Her husband continues to judiciously and carefully pack/wick the inferior apex of her incision which has just a small sinus tract that probably goes down to fascia.  The only type of drainage that are getting is a bloody drainage with dressing changes which they are doing twice daily.  Vital signs: BP 135/65   Pulse 65   Temp 98.2 F (36.8 C)   Ht 5' 5.5" (1.664 m)   Wt 206 lb (93.4 kg)   SpO2 98%   BMI 33.76 kg/m    Physical Exam: Constitutional: She appears great. Abdomen: Soft nontender.  Colostomy appears to be functioning well.  They reported there is a little bit of a losing bleeding edge in the inferior aspect of the stoma at the junction with the skin. Skin: Her incision looks super there is a small sinus track opening just large enough to accommodate quarter inch wicking with a Q-tip.  There is no evidence of any retained fluid and no evidence of infection whatsoever.  Is well granulated.  Assessment/Plan: This is a 70 y.o. female 5 weeks s/p Hartman's procedure.  Patient Active Problem List   Diagnosis Date Noted   Hardening of the aorta (main artery of the heart) ([place]) 09/29/2021   Hypertrophic condition of skin 09/29/2021   Status post colostomy ([place]) 09/29/2021   Pain in left leg 12/15/2019   Thrombophlebitis of superficial veins of lower extremity 12/15/2019   Hyperlipidemia 05/14/2019   Chronic kidney disease, stage 3a ([place]) 12/29/2016   Hypothyroidism 12/29/2016   Phlebitis and thrombophlebitis 12/29/2016   Nutritional anemia 10/04/2016   Diverticular disease of colon 10/03/2016   Right lower quadrant pain 05/17/2016   Dysuria  05/15/2016   Acute pharyngitis 02/15/2016   Fever 02/15/2016   Pain 02/15/2016   Streptococcal pharyngitis 02/15/2016   Acquired absence of ovaries, 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   Disorder of bone 12/21/2015   Encounter for general adult medical examination without abnormal findings 12/02/2015   Trigeminal neuralgia 09/07/2015   Benign paroxysmal positional vertigo 05/24/2015   Obesity 07/31/2014   Herpes zoster without complication 40/98/1191   Abnormal results of thyroid function studies 07/20/2011   Vitamin D deficiency 09/15/2010   Sleep apnea 09/14/2010   Abnormal uterine bleeding 07/26/2010   Benign neoplasm of colon 07/26/2010   Snoring 07/26/2010   Chronic migraine without aura, not intractable, without status migrainosus 07/18/2010   Hx of colonic polyps - diminutive adenomas 10/12/2006    -Advised that it is in everyone's interest to give time for pelvic scarring to heal for eventual takedown.  We will have them follow-up in 6 weeks for assessment of progress in of her incisional wound.  We will see her back as needed in the interim.  -Although not discussed during her visit anticipate her completing her Xarelto course for the superficial thrombophlebitis.  We will reassess her pending symptoms.  Ronny Bacon M.D., FACS 10/18/2021, 9:34 AM

## 2021-10-27 DIAGNOSIS — E785 Hyperlipidemia, unspecified: Secondary | ICD-10-CM | POA: Diagnosis not present

## 2021-10-27 DIAGNOSIS — Z9181 History of falling: Secondary | ICD-10-CM | POA: Diagnosis not present

## 2021-10-27 DIAGNOSIS — E039 Hypothyroidism, unspecified: Secondary | ICD-10-CM | POA: Diagnosis not present

## 2021-10-27 DIAGNOSIS — K529 Noninfective gastroenteritis and colitis, unspecified: Secondary | ICD-10-CM | POA: Diagnosis not present

## 2021-10-27 DIAGNOSIS — K5732 Diverticulitis of large intestine without perforation or abscess without bleeding: Secondary | ICD-10-CM | POA: Diagnosis not present

## 2021-10-27 DIAGNOSIS — Z48815 Encounter for surgical aftercare following surgery on the digestive system: Secondary | ICD-10-CM | POA: Diagnosis not present

## 2021-10-27 DIAGNOSIS — Z86010 Personal history of colonic polyps: Secondary | ICD-10-CM | POA: Diagnosis not present

## 2021-10-27 DIAGNOSIS — Z4801 Encounter for change or removal of surgical wound dressing: Secondary | ICD-10-CM | POA: Diagnosis not present

## 2021-10-27 DIAGNOSIS — K6389 Other specified diseases of intestine: Secondary | ICD-10-CM | POA: Diagnosis not present

## 2021-10-27 DIAGNOSIS — Z433 Encounter for attention to colostomy: Secondary | ICD-10-CM | POA: Diagnosis not present

## 2021-10-27 DIAGNOSIS — K5792 Diverticulitis of intestine, part unspecified, without perforation or abscess without bleeding: Secondary | ICD-10-CM | POA: Diagnosis not present

## 2021-10-27 DIAGNOSIS — G4700 Insomnia, unspecified: Secondary | ICD-10-CM | POA: Diagnosis not present

## 2021-11-29 ENCOUNTER — Encounter: Payer: Self-pay | Admitting: Surgery

## 2021-11-29 ENCOUNTER — Other Ambulatory Visit: Payer: Self-pay

## 2021-11-29 ENCOUNTER — Ambulatory Visit (INDEPENDENT_AMBULATORY_CARE_PROVIDER_SITE_OTHER): Payer: PPO | Admitting: Surgery

## 2021-11-29 VITALS — BP 155/79 | HR 68 | Temp 98.3°F | Ht 65.5 in | Wt 208.0 lb

## 2021-11-29 DIAGNOSIS — Z09 Encounter for follow-up examination after completed treatment for conditions other than malignant neoplasm: Secondary | ICD-10-CM

## 2021-11-29 DIAGNOSIS — Z933 Colostomy status: Secondary | ICD-10-CM

## 2021-11-29 NOTE — Progress Notes (Signed)
[facility] POST-OP OFFICE VISIT  11/29/2021  HPI: Abigail Mitchell is a 71 y.o. female who is approaching 3 months s/p Hartman's procedure for sigmoid stenosis secondary to diverticulitis.  She appears to be tolerating colostomy function well, not having any significant rectal output.  Her incisions have reportedly all healed well.  She denies any pain or discomfort.  She is tolerating a regular diet without issue.  I believe she was hopeful we would proceed with reversal sooner than I had planned.  Vital signs: BP (!) 155/79    Pulse 68    Temp 98.3 F (36.8 C)    Ht 5' 5.5" (1.664 m)    Wt 208 lb (94.3 kg)    SpO2 97%    BMI 34.09 kg/m    Physical Exam: Constitutional: She appears well. Abdomen: Soft and nontender.  No appreciable midline defects.  Stoma in the left side appears pink and functional. Skin: Intact.  Assessment/Plan: This is a 71 y.o. female 3 months days s/p Hartman's procedure, for stricturing and large bowel obstruction.  Patient Active Problem List   Diagnosis Date Noted   Hardening of the aorta (main artery of the heart) ([place]) 09/29/2021   Hypertrophic condition of skin 09/29/2021   Status post colostomy ([place]) 09/29/2021   Pain in left leg 12/15/2019   Thrombophlebitis of superficial veins of lower extremity 12/15/2019   Hyperlipidemia 05/14/2019   Chronic kidney disease, stage 3a ([place]) 12/29/2016   Hypothyroidism 12/29/2016   Phlebitis and thrombophlebitis 12/29/2016   Nutritional anemia 10/04/2016   Diverticular disease of colon 10/03/2016   Right lower quadrant pain 05/17/2016   Dysuria 05/15/2016   Acute pharyngitis 02/15/2016   Fever 02/15/2016   Pain 02/15/2016   Streptococcal pharyngitis 02/15/2016   Acquired absence of ovaries, 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   Disorder of bone 12/21/2015   Encounter for general adult medical examination without abnormal findings 12/02/2015   Trigeminal neuralgia 09/07/2015   Benign paroxysmal positional vertigo  05/24/2015   Obesity 07/31/2014   Herpes zoster without complication 49/20/1007   Abnormal results of thyroid function studies 07/20/2011   Vitamin D deficiency 09/15/2010   Sleep apnea 09/14/2010   Abnormal uterine bleeding 07/26/2010   Benign neoplasm of colon 07/26/2010   Snoring 07/26/2010   Chronic migraine without aura, not intractable, without status migrainosus 07/18/2010   Hx of colonic polyps - diminutive adenomas 10/12/2006    -Upon review, there are a few items that should be addressed to optimize her colostomy takedown. We discussed primarily giving her more time for the operative scarring to diminish, and to evaluate the distal rectum with a barium enema.  Unfortunately I likely did not emphasize the opportunities to improve other areas to diminish her overall operative risks needs include her BMI today of 34.  She cannot do too much else regarding her chronic renal disease.  However weight reduction would help significantly with her history of sleep apnea, and her recovery.  Hopefully these are things that will come to mind and motivate her over the next couple months.   We will anticipate seeing her back in about 3 months.   Ronny Bacon M.D., FACS 11/29/2021, 9:11 AM

## 2021-11-29 NOTE — Patient Instructions (Addendum)
You may get a bowel movement or mucus from your rectum from time to time. This is normal.  We will have you follow up here in 3 months. We will discuss having your next surgery.   We will have you do a Barium Enema before you come back. We will call you about this.     Please call and ask to speak with a nurse if you develop questions or concerns.

## 2022-02-08 ENCOUNTER — Other Ambulatory Visit: Payer: Self-pay

## 2022-02-08 DIAGNOSIS — Z933 Colostomy status: Secondary | ICD-10-CM

## 2022-02-08 DIAGNOSIS — K5730 Diverticulosis of large intestine without perforation or abscess without bleeding: Secondary | ICD-10-CM

## 2022-02-09 ENCOUNTER — Telehealth: Payer: Self-pay

## 2022-02-09 NOTE — Telephone Encounter (Signed)
Message left for the patient to call back. She is scheduled for a follow up in April, but will need a Barium Enema study prior to this per Dr Christian Mate.  ?

## 2022-02-10 NOTE — Telephone Encounter (Signed)
Patient is scheduled for a Barium Enema at [facility]  on 02/24/22 at 9:00 am. She will need to arrive at the [facility] by 8:30 am. She will need to have nothing to eat or drink after midnight, she may take her Thyroid medication at 6 am with a sip of water. She will not need prep for this exam as she has a colostomy in place.  ?

## 2022-02-24 ENCOUNTER — Ambulatory Visit
Admission: RE | Admit: 2022-02-24 | Discharge: 2022-02-24 | Disposition: A | Discharge status: Home / Self Care | Payer: PPO | Source: Ambulatory Visit | Attending: Surgery | Admitting: Surgery

## 2022-02-24 DIAGNOSIS — K5730 Diverticulosis of large intestine without perforation or abscess without bleeding: Secondary | ICD-10-CM | POA: Insufficient documentation

## 2022-02-24 DIAGNOSIS — Z933 Colostomy status: Secondary | ICD-10-CM | POA: Insufficient documentation

## 2022-03-02 ENCOUNTER — Ambulatory Visit (INDEPENDENT_AMBULATORY_CARE_PROVIDER_SITE_OTHER): Payer: PPO | Admitting: Surgery

## 2022-03-02 ENCOUNTER — Telehealth: Payer: Self-pay

## 2022-03-02 ENCOUNTER — Encounter: Payer: Self-pay | Admitting: Surgery

## 2022-03-02 VITALS — BP 154/85 | HR 71 | Temp 98.3°F | Ht 66.5 in | Wt 212.8 lb

## 2022-03-02 DIAGNOSIS — Z1211 Encounter for screening for malignant neoplasm of colon: Secondary | ICD-10-CM | POA: Diagnosis not present

## 2022-03-02 DIAGNOSIS — Z933 Colostomy status: Secondary | ICD-10-CM | POA: Diagnosis not present

## 2022-03-02 NOTE — Progress Notes (Signed)
Surgical Clinic Progress/Follow-up Note  ? ?HPI:  ?71 y.o. Female presents to clinic for colostomy status follow-up.  6 months follow the last operation, a Hartman's procedure for obstructing diverticulitis. Patient reports  improvement/resolution of prior issues and has been tolerating regular diet with +flatus and normal BM's, denies N/V, fever/chills, CP, or SOB. ?She said her last screening colonoscopy was negative for that was 6 or 7 years ago with [place].  I thought it may be prudent to screen her once again prior to surgery, however she really wishes to have this completed prior to summer vacation time. ?Review of Systems:  ?Constitutional: denies fever/chills  ?ENT: denies sore throat, hearing problems  ?Respiratory: denies shortness of breath, wheezing  ?Cardiovascular: denies chest pain, palpitations  ?Gastrointestinal: denies abdominal pain, N/V, or diarrhea ?Skin: Denies any other rashes or skin discolorations  ? ?Vital Signs:  ?There were no vitals taken for this visit.  ? ?Physical Exam:  ?Constitutional:  ?-- Obese body habitus  ?-- Awake, alert, and oriented x3  ?Pulmonary:  ?-- No crackles ?-- Equal breath sounds bilaterally ?-- Breathing non-labored at rest ?Cardiovascular:  ?-- S1, S2 present  ?-- No pericardial rubs  ?Gastrointestinal:  ?-- Soft and non-distended, non-tender/with no incisional tenderness to palpation, no guarding/rebound tenderness.  No appreciable fascial defects in the midline incision.  Colostomy appliance appears to be well fit with a healthy everted stoma that is functioning. ?-- No abdominal masses appreciated, pulsatile or otherwise  ?  ?Musculoskeletal / Integumentary:  ?-- Wounds or skin discoloration: None appreciated ?-- Extremities: B/L UE and LE FROM, hands and feet warm, no edema  ? ? ? ?Imaging:  ? ?CLINICAL DATA:  Status post Hartmann procedure, reversal planned ?  ?EXAM: ?SINGLE CONTRAST BARIUM ENEMA ?  ?TECHNIQUE: ?Initial scout AP supine abdominal image  obtained to insure adequate ?colon cleansing. Water-soluble Gastrografin contrast was introduced ?into the rectum. Spot images of the colon followed by overhead ?radiographs were obtained. ?  ?FLUOROSCOPY: ?Radiation Exposure Index (as provided by the fluoroscopic device): ?22.2 mGy Kerma ?  ?COMPARISON:  Barium enema, 09/07/2021 ?  ?FINDINGS: ?Initial scout images reveal dense stool balls within the rectal ?stump, presumably retained contrast from prior examination. The ?catheter was inserted into the rectum without resistance and ?water-soluble contrast was introduced into the colon under gravity. ?Adequate filling of the rectal stump, with occasional diverticula ?but generally normal contour and caliber of the distal remnant ?colon. ?  ?IMPRESSION: ?Occasional diverticula, but generally normal contour and caliber of ?the distal remnant colon following Hartmann procedure. No evidence ?of stump leak. ?  ?  ?Electronically Signed ?  By: Delanna Ahmadi M.D. ?  On: 02/24/2022 09:23 ? ? ?Assessment:  ?71 y.o. yo Female with a problem list including...  ?Patient Active Problem List  ? Diagnosis Date Noted  ? Hardening of the aorta (main artery of the heart) ([place]) 09/29/2021  ? Hypertrophic condition of skin 09/29/2021  ? Status post colostomy ([place]) 09/29/2021  ? Pain in left leg 12/15/2019  ? Thrombophlebitis of superficial veins of lower extremity 12/15/2019  ? Hyperlipidemia 05/14/2019  ? Chronic kidney disease, stage 3a ([place]) 12/29/2016  ? Hypothyroidism 12/29/2016  ? Phlebitis and thrombophlebitis 12/29/2016  ? Nutritional anemia 10/04/2016  ? Diverticular disease of colon 10/03/2016  ? Right lower quadrant pain 05/17/2016  ? Dysuria 05/15/2016  ? Acute pharyngitis 02/15/2016  ? Fever 02/15/2016  ? Pain 02/15/2016  ? Streptococcal pharyngitis 02/15/2016  ? Acquired absence of ovaries, 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  ?  Disorder of bone 12/21/2015  ? Encounter for general adult medical examination without abnormal findings  12/02/2015  ? Trigeminal neuralgia 09/07/2015  ? Benign paroxysmal positional vertigo 05/24/2015  ? Obesity 07/31/2014  ? Herpes zoster without complication 63/14/9702  ? Abnormal results of thyroid function studies 07/20/2011  ? Vitamin D deficiency 09/15/2010  ? Sleep apnea 09/14/2010  ? Abnormal uterine bleeding 07/26/2010  ? Benign neoplasm of colon 07/26/2010  ? Snoring 07/26/2010  ? Chronic migraine without aura, not intractable, without status migrainosus 07/18/2010  ? Hx of colonic polyps - diminutive adenomas 10/12/2006  ?  ?presents to clinic for follow-up evaluation of colostomy status, desiring reversal, otherwise progressing well. ? ?Plan:  ?            -Due to her summer vacation plans, she places and urgency on proceeding with surgery sooner than later.  We will explore the possibility of her getting a colonoscopy on a semiurgent basis to ensure no surprises following her procedure. ?We will tentatively look at last week of April, will need to discuss with her prepping for the operation should we not be able to coordinate her colonoscopy immediately prior. ? ?All of the above recommendations were discussed with the patient, and all of patient's questions were answered to her expressed satisfaction. ? ?These notes generated with voice recognition software. I apologize for typographical errors. ? ?Ronny Bacon, MD, FACS ?[facility] ?General Surgery - Partnering for exceptional care. ?Office: [phone]  ?

## 2022-03-02 NOTE — Patient Instructions (Addendum)
Our surgery scheduler Pamala Hurry will call you within 24-48 hours to get you scheduled. If you have not heard from her after 48 hours, please call our office. Have the blue sheet available when she calls to write down important information. ? ?A referral has been placed to [facility] for a colonoscopy. They will call you with an appointment.  ? ?If you have any concerns or questions, please feel free to call our office.  ? ?Colostomy Reversal Surgery ?A colostomy reversal is a surgical procedure that is done to reverse a colostomy. In this reversal procedure, the large intestine is disconnected from the opening in the abdomen (stoma). Then, the changes that were made to the intestine during the colostomy will be reversed to restore the flow of stool through the entire intestine. Depending on the type of colostomy being reversed, this may involve one of the following: ?Reconnecting the two ends of the intestine that were separated during colostomy surgery. ?Closing the opening that was made in the side of the intestine to allow stool to be redirected through the stoma. ?After this surgery, a stoma and colostomy bag are no longer needed. Stool (feces) can leave your body through the rectum, as it did before you had a colostomy. ?Tell a health care provider about: ?Any allergies you have. ?All medicines you are taking, including vitamins, herbs, eye drops, creams, and over-the-counter medicines. ?Any problems you or family members have had with anesthetic medicines. ?Any blood disorders you have. ?Any surgeries you have had. ?Any medical conditions you have. ?Whether you are pregnant or may be pregnant. ?What are the risks? ?Generally, this is a safe procedure. However, problems may occur, including: ?Infection. ?Bleeding. ?Allergic reactions to medicines. ?Damage to other structures or organs. ?A temporary condition in which the intestines stop moving and working correctly (ileus). This usually goes away in 3-7 days. ?A  collection of pus (abscess) in the abdomen or pelvis. ?Intestinal blockage. ?Leaking at the area of the intestine where it was reconnected (anastomotic leak) or where the opening of the stoma was closed. ?Narrowing of the intestine (stricture) at the place where it was reconnected. ?Urinary and sexual dysfunction. ?What happens before the procedure? ?Medicines ?Ask your health care provider about: ?Changing or stopping your regular medicines. This is especially important if you are taking diabetes medicines or blood thinners. ?Taking medicines such as aspirin and ibuprofen. These medicines can thin your blood. Do not take these medicines unless your health care provider tells you to take them. ?Taking over-the-counter medicines, vitamins, herbs, and supplements. ?Staying hydrated ?Follow instructions from your health care provider about hydration, which may include: ?Up to 2 hours before the procedure - you may continue to drink clear liquids, such as water, clear fruit juice, black coffee, and plain tea. ? ?Eating and drinking restrictions ?Follow instructions from your health care provider about eating and drinking, which may include: ?8 hours before the procedure - stop eating heavy meals or foods, such as meat, fried foods, or fatty foods. ?6 hours before the procedure - stop eating light meals or foods, such as toast or cereal. ?6 hours before the procedure - stop drinking milk or drinks that contain milk. ?2 hours before the procedure - stop drinking clear liquids. ?General instructions ?You may have an exam or testing. ?Plan to have someone take you home after the procedure. ?Plan to have a responsible adult care for you for at least 24 hours after you leave the hospital or clinic. This is important. ?Do  not use any products that contain nicotine or tobacco, such as cigarettes, e-cigarettes, and chewing tobacco. These can delay incision healing after surgery. If you need help quitting, ask your health care  provider. ?Ask your health care provider what steps will be taken to help prevent infection. These may include: ?Removing hair at the surgery site. ?Washing skin with a germ-killing soap. ?Antibiotic medicine. ?What happens during the procedure? ? ?An IV will be inserted into one of your veins. ?You may be given: ?A medicine to help you relax (sedative). ?A medicine to make you fall asleep (general anesthetic). ?An incision will be made in your abdomen at the site of the stoma. ?The large intestine will be disconnected from the abdomen at the site of the stoma. ?The next steps will vary depending on the type of colostomy reversal surgery you are having. There are two main types: ?End colostomy reversal. The surgeon will use stitches (sutures) or staples to reconnect the two ends of the intestine that were separated during the end colostomy. ?Loop colostomy reversal. The surgeon will use sutures or staples to close the opening in the intestine that had been allowing stool to be redirected through the stoma. The intestine will then be put back into its normal position inside the abdomen. ?The incision will be closed with sutures, skin glue, or adhesive strips. It may be covered with bandages (dressings). ?The procedure may vary among health care providers and hospitals. ?What happens after the procedure? ?Your blood pressure, heart rate, breathing rate, and blood oxygen level will be monitored until you leave the hospital or clinic. ?You will be given pain medicine as needed. ?You will slowly increase your diet and movement as told by your health care provider. ?Summary ?A colostomy reversal is a surgical procedure that is done to reverse a colostomy. After this surgery, stool (feces) can leave your body through the rectum, as it did before you had a colostomy. ?Before the procedure, follow instructions from your health care provider about taking medicines and about eating and drinking. ?During the procedure, your  colostomy will be reversed, and the incision will be closed with sutures, skin glue, or adhesive strips. It may be covered with bandages (dressings). ?After the procedure, you will slowly increase your diet and movement as told by your health care provider. ?This information is not intended to replace advice given to you by your health care provider. Make sure you discuss any questions you have with your health care provider. ?Document Revised: 05/01/2018 Document Reviewed: 05/01/2018 ?Elsevier Patient Education ? [place]. ? ?

## 2022-03-02 NOTE — Telephone Encounter (Signed)
CALLED PATIENT NO ANSWER LEFT VOICEMAIL FOR A CALL BACK ? ?

## 2022-03-02 NOTE — Telephone Encounter (Signed)
Patient returned your call, requesting call back. (Home phone or mobile) Patient is wondering if she can get a colonoscopy before 03/20/2022. ?

## 2022-03-06 NOTE — Telephone Encounter (Signed)
Patient requesting a call back to reschedule colonoscopy. ?

## 2022-03-07 ENCOUNTER — Telehealth: Payer: Self-pay | Admitting: Surgery

## 2022-03-07 ENCOUNTER — Encounter: Payer: Self-pay | Admitting: Gastroenterology

## 2022-03-07 ENCOUNTER — Other Ambulatory Visit: Payer: Self-pay

## 2022-03-07 DIAGNOSIS — Z1211 Encounter for screening for malignant neoplasm of colon: Secondary | ICD-10-CM

## 2022-03-07 MED ORDER — BISACODYL 5 MG PO TBEC: DELAYED_RELEASE_TABLET | ORAL | 0 refills | Status: DC

## 2022-03-07 MED ORDER — METRONIDAZOLE 500 MG PO TABS: ORAL_TABLET | ORAL | 0 refills | Status: DC

## 2022-03-07 MED ORDER — POLYETHYLENE GLYCOL 3350 17 GM/SCOOP PO POWD: ORAL | 0 refills | Status: DC

## 2022-03-07 MED ORDER — NEOMYCIN SULFATE 500 MG PO TABS: ORAL_TABLET | ORAL | 0 refills | Status: DC

## 2022-03-07 MED ORDER — PEG 3350-KCL-NA BICARB-NACL 420 G PO SOLR: 4000.0000 mL | Freq: Once | ORAL | 0 refills | Status: AC

## 2022-03-07 NOTE — Telephone Encounter (Signed)
Patient has been advised of Pre-Admission date/time, COVID Testing date and Surgery date. ? ?Surgery Date: 03/20/22 ?Preadmission Testing Date: 03/13/22 (phone 8a-1p) ?Covid Testing Date: Not needed.    ? ?Patient has been made aware to call [phone], between 1-3:00pm the day before surgery, to find out what time to arrive for surgery.   ? ?Also Dr. Christian Mate does  want patient to do bowel prep prior to surgery.  She will come by the office to pick up bowel prep instructions.  [facility]A Nurse called in Rx for bowel prep to the patient's pharmacy.   ? ?

## 2022-03-07 NOTE — Progress Notes (Signed)
Gastroenterology Pre-Procedure Review ? ?Request Date: 03/10/2022 ?Requesting Physician: Dr. Allen Norris ? ?PATIENT REVIEW QUESTIONS: The patient responded to the following health history questions as indicated:   ? ?1. Are you having any GI issues? no ?2. Do you have a personal history of Polyps? no ?3. Do you have a family history of Colon Cancer or Polyps? no ?4. Diabetes Mellitus? no ?5. Joint replacements in the past 12 months?no ?6. Major health problems in the past 3 months?has colostomy bag  ?7. Any artificial heart valves, MVP, or defibrillator?no ?   ?MEDICATIONS & ALLERGIES:    ?Patient reports the following regarding taking any anticoagulation/antiplatelet therapy:   ?Plavix, Coumadin, Eliquis, Xarelto, Lovenox, Pradaxa, Brilinta, or Effient? no ?Aspirin? no ? ?Patient confirms/reports the following medications:  ?Current Outpatient Medications  ?Medication Sig Dispense Refill  ? acetaminophen (TYLENOL) 500 MG tablet Take 500 mg by mouth every 6 (six) hours as needed.    ? ibandronate (BONIVA) 150 MG tablet Take 150 mg by mouth every 30 (thirty) days.    ? levothyroxine (SYNTHROID) 50 MCG tablet Take 50 mcg by mouth daily.    ? montelukast (SINGULAIR) 10 MG tablet Take 10 mg by mouth daily as needed.    ? rosuvastatin (CRESTOR) 10 MG tablet Take 10 mg by mouth daily.    ? Vitamin D, Ergocalciferol, (DRISDOL) 50000 UNITS CAPS capsule Take 50,000 Units by mouth every 7 (seven) days.    ? ?No current facility-administered medications for this visit.  ? ? ?Patient confirms/reports the following allergies:  ?Allergies  ?Allergen Reactions  ? Cefdinir Rash  ?  Rash in mouth  ? ? ?No orders of the defined types were placed in this encounter. ? ? ?AUTHORIZATION INFORMATION ?Primary Insurance: ?1D#: ?Group #: ? ?Secondary Insurance: ?1D#: ?Group #: ? ?SCHEDULE INFORMATION: ?Date:  ?03/10/2022 ?Time: ?Location:[facility] ? ?

## 2022-03-10 ENCOUNTER — Encounter: Payer: Self-pay | Admitting: Gastroenterology

## 2022-03-10 ENCOUNTER — Other Ambulatory Visit: Payer: Self-pay

## 2022-03-10 ENCOUNTER — Encounter
Admission: RE | Disposition: A | Discharge status: Home / Self Care | Payer: Self-pay | Source: Home / Self Care | Attending: Gastroenterology

## 2022-03-10 ENCOUNTER — Ambulatory Visit
Admission: RE | Admit: 2022-03-10 | Discharge: 2022-03-10 | Disposition: A | Discharge status: Home / Self Care | Payer: PPO | Attending: Gastroenterology | Admitting: Gastroenterology

## 2022-03-10 ENCOUNTER — Ambulatory Visit: Payer: PPO | Admitting: Anesthesiology

## 2022-03-10 DIAGNOSIS — K635 Polyp of colon: Secondary | ICD-10-CM | POA: Diagnosis not present

## 2022-03-10 DIAGNOSIS — D124 Benign neoplasm of descending colon: Secondary | ICD-10-CM | POA: Insufficient documentation

## 2022-03-10 DIAGNOSIS — E039 Hypothyroidism, unspecified: Secondary | ICD-10-CM | POA: Diagnosis not present

## 2022-03-10 DIAGNOSIS — G4730 Sleep apnea, unspecified: Secondary | ICD-10-CM | POA: Insufficient documentation

## 2022-03-10 DIAGNOSIS — Z1211 Encounter for screening for malignant neoplasm of colon: Secondary | ICD-10-CM

## 2022-03-10 DIAGNOSIS — Z933 Colostomy status: Secondary | ICD-10-CM | POA: Diagnosis not present

## 2022-03-10 DIAGNOSIS — D126 Benign neoplasm of colon, unspecified: Secondary | ICD-10-CM | POA: Diagnosis not present

## 2022-03-10 HISTORY — PX: COLONOSCOPY WITH PROPOFOL: SHX5780

## 2022-03-10 HISTORY — DX: Hypothyroidism, unspecified: E03.9

## 2022-03-10 HISTORY — DX: Dizziness and giddiness: R42

## 2022-03-10 HISTORY — DX: Presence of spectacles and contact lenses: Z97.3

## 2022-03-10 SURGERY — COLONOSCOPY WITH PROPOFOL
Anesthesia: General | Site: Rectum

## 2022-03-10 MED ORDER — STERILE WATER FOR IRRIGATION IR SOLN
Status: DC | PRN
Start: 2022-03-10 — End: 2022-03-10
  Administered 2022-03-10: 250 mL

## 2022-03-10 MED ORDER — LACTATED RINGERS IV SOLN: INTRAVENOUS | Status: DC

## 2022-03-10 MED ORDER — LIDOCAINE HCL (CARDIAC) PF 100 MG/5ML IV SOSY
PREFILLED_SYRINGE | INTRAVENOUS | Status: DC | PRN
  Administered 2022-03-10: 30 mg via INTRAVENOUS

## 2022-03-10 MED ORDER — ACETAMINOPHEN 325 MG PO TABS: 325.0000 mg | ORAL_TABLET | Freq: Once | ORAL | Status: DC

## 2022-03-10 MED ORDER — PROPOFOL 10 MG/ML IV BOLUS
INTRAVENOUS | Status: DC | PRN
  Administered 2022-03-10 (×4): 30 mg via INTRAVENOUS
  Administered 2022-03-10: 20 mg via INTRAVENOUS
  Administered 2022-03-10: 100 mg via INTRAVENOUS

## 2022-03-10 MED ORDER — ACETAMINOPHEN 160 MG/5ML PO SOLN: 325.0000 mg | Freq: Once | ORAL | Status: DC

## 2022-03-10 MED ORDER — SODIUM CHLORIDE 0.9 % IV SOLN: INTRAVENOUS | Status: DC

## 2022-03-10 SURGICAL SUPPLY — 22 items
CLIP HMST 235XBRD CATH ROT (MISCELLANEOUS) IMPLANT
CLIP RESOLUTION 360 11X235 (MISCELLANEOUS)
ELECT REM PT RETURN 9FT ADLT (ELECTROSURGICAL)
ELECTRODE REM PT RTRN 9FT ADLT (ELECTROSURGICAL) IMPLANT
FORCEPS BIOP RAD 4 LRG CAP 4 (CUTTING FORCEPS) ×1 IMPLANT
GOWN CVR UNV OPN BCK APRN NK (MISCELLANEOUS) ×4 IMPLANT
GOWN ISOL THUMB LOOP REG UNIV (MISCELLANEOUS) ×4
INJECTOR VARIJECT VIN23 (MISCELLANEOUS) IMPLANT
KIT DEFENDO VALVE AND CONN (KITS) IMPLANT
KIT PRC NS LF DISP ENDO (KITS) ×2 IMPLANT
KIT PROCEDURE OLYMPUS (KITS) ×2
MANIFOLD NEPTUNE II (INSTRUMENTS) ×3 IMPLANT
MARKER SPOT ENDO TATTOO 5ML (MISCELLANEOUS) IMPLANT
PROBE APC STR FIRE (PROBE) IMPLANT
RETRIEVER NET ROTH 2.5X230 LF (MISCELLANEOUS) IMPLANT
SNARE COLD EXACTO (MISCELLANEOUS) IMPLANT
SNARE SHORT THROW 13M SML OVAL (MISCELLANEOUS) IMPLANT
SNARE SNG USE RND 15MM (INSTRUMENTS) IMPLANT
SPOT EX ENDOSCOPIC TATTOO (MISCELLANEOUS)
TRAP ETRAP POLY (MISCELLANEOUS) IMPLANT
VARIJECT INJECTOR VIN23 (MISCELLANEOUS)
WATER STERILE IRR 250ML POUR (IV SOLUTION) ×3 IMPLANT

## 2022-03-10 NOTE — Anesthesia Procedure Notes (Signed)
Date/Time: 03/10/2022 11:24 AM ?Performed by: Cameron Ali, CRNA ?Pre-anesthesia Checklist: Patient identified, Emergency Drugs available, Suction available, Timeout performed and Patient being monitored ?Patient Re-evaluated:Patient Re-evaluated prior to induction ?Oxygen Delivery Method: Nasal cannula ?Placement Confirmation: positive ETCO2 ? ? ? ? ?

## 2022-03-10 NOTE — Op Note (Signed)
[facility] ?Gastroenterology ?Patient Name: Abigail Mitchell ?Procedure Date: 03/10/2022 11:16 AM ?MRN: [id number] ?Account #: [id number] ?Date of Birth: 1951/01/05 ?Admit Type: Outpatient ?Age: 71 ?Room: [facility] OR ROOM 01 ?Gender: Female ?Note Status: Finalized ?Instrument Name: [id number] ?Procedure:             Colonoscopy ?Indications:           Screening for colorectal malignant neoplasm before  ?                       reversal of colostomy ?Providers:             [name] MD, MD ?Referring MD:          Jeannette How. Joylene Draft, MD (Referring MD) ?Medicines:             Propofol per Anesthesia ?Complications:         No immediate complications. ?Procedure:             Pre-Anesthesia Assessment: ?                       - Prior to the procedure, a History and Physical was  ?                       performed, and patient medications and allergies were  ?                       reviewed. The patient's tolerance of previous  ?                       anesthesia was also reviewed. The risks and benefits  ?                       of the procedure and the sedation options and risks  ?                       were discussed with the patient. All questions were  ?                       answered, and informed consent was obtained. Prior  ?                       Anticoagulants: The patient has taken no previous  ?                       anticoagulant or antiplatelet agents. ASA Grade  ?                       Assessment: II - A patient with mild systemic disease.  ?                       After reviewing the risks and benefits, the patient  ?                       was deemed in satisfactory condition to undergo the  ?                       procedure. ?  After obtaining informed consent, the colonoscope was  ?                       passed under direct vision. Throughout the procedure,  ?                       the patient's blood pressure, pulse, and oxygen  ?                       saturations were  monitored continuously. The  ?                       Colonoscope was introduced through the descending  ?                       colostomy and advanced to the the cecum, identified by  ?                       appendiceal orifice and ileocecal valve. The  ?                       colonoscopy was performed without difficulty. The  ?                       patient tolerated the procedure well. The quality of  ?                       the bowel preparation was excellent. The Colonoscope  ?                       was introduced through the anus and advanced to the  ?                       the colocolonic anastomosis created status post left  ?                       colectomy. ?Findings: ?     The perianal and digital rectal examinations were normal. ?     A 4 mm polyp was found in the descending colon. The polyp was sessile.  ?     The polyp was removed with a cold biopsy forceps. Resection and  ?     retrieval were complete. ?Impression:            - One 4 mm polyp in the descending colon, removed with  ?                       a cold biopsy forceps. Resected and retrieved. ?Recommendation:        - Discharge patient to home. ?                       - Resume previous diet. ?                       - Continue present medications. ?                       - Await pathology results. ?                       -  Repeat colonoscopy is not recommended for  ?                       surveillance. ?Procedure Code(s):     --- Professional --- ?                       [phone], Colonoscopy through stoma; with biopsy, single  ?                       or multiple ?Diagnosis Code(s):     --- Professional --- ?                       Z12.11, Encounter for screening for malignant neoplasm  ?                       of colon ?                       K63.5, Polyp of colon ?CPT copyright 2019 American Medical Association. All rights reserved. ?The codes documented in this report are preliminary and upon coder review may  ?be revised to meet current compliance  requirements. ?[name] MD, MD ?03/10/2022 11:44:47 AM ?This report has been signed electronically. ?Number of Addenda: 0 ?Note Initiated On: 03/10/2022 11:16 AM ?Scope Withdrawal Time: 0 hours 8 minutes 16 seconds  ?Total Procedure Duration: 0 hours 11 minutes 41 seconds  ?Estimated Blood Loss:  Estimated blood loss: none. ?     [facility] ?

## 2022-03-10 NOTE — H&P (Signed)
? ?[name], MD [facility] ?[address] ?[place], [place] [address] ?[phone] ?Fax : [phone] ? ?Primary Care Physician:  Crist Infante, MD ?Primary Gastroenterologist:  Dr. Allen Norris ? ?Pre-Procedure History & Physical: ?HPI:  Abigail Mitchell is a 71 y.o. female is here for an colonoscopy. ?  ?Past Medical History:  ?Diagnosis Date  ? Diverticulosis   ? Hx of colonic polyps - diminutive adenomas 10/12/2006  ? Hypothyroidism   ? Insomnia   ? Superficial thrombophlebitis 08/2021  ? after surgery.  Treated with 4 weeks of Xarelto.  ? Vertigo   ? no episodes in over 2 years  ? Wears contact lenses   ? ? ?Past Surgical History:  ?Procedure Laterality Date  ? ABDOMINAL HYSTERECTOMY    ? BREAST SURGERY    ? calcification  ? CESAREAN SECTION    ? COLECTOMY WITH COLOSTOMY CREATION/HARTMANN PROCEDURE N/A 09/09/2021  ? Procedure: COLECTOMY WITH COLOSTOMY CREATION/HARTMANN PROCEDURE;  Surgeon: Ronny Bacon, MD;  Location: [facility] ORS;  Service: General;  Laterality: N/A;  ? COLONOSCOPY    ? TONSILLECTOMY    ? TUBAL LIGATION    ? ? ?Prior to Admission medications   ?Medication Sig Start Date End Date Taking? Authorizing Provider  ?acetaminophen (TYLENOL) 500 MG tablet Take 500 mg by mouth every 6 (six) hours as needed for moderate pain.   Yes [provider]  ?fluticasone (FLONASE) 50 MCG/ACT nasal spray Place 1 spray into both nostrils at bedtime.   Yes [provider]  ?ibandronate (BONIVA) 150 MG tablet Take 150 mg by mouth every 30 (thirty) days. 10/04/21  Yes [provider]  ?levothyroxine (SYNTHROID) 50 MCG tablet Take 50 mcg by mouth daily. 06/27/21  Yes [provider]  ?montelukast (SINGULAIR) 10 MG tablet Take 10 mg by mouth at bedtime. 06/24/21  Yes [provider]  ?rosuvastatin (CRESTOR) 10 MG tablet Take 10 mg by mouth daily. 07/27/21  Yes [provider]  ?Vitamin D, Ergocalciferol, (DRISDOL) 1.25 MG (50000 UNIT) CAPS capsule Take 50,000 Units by mouth  every 7 (seven) days.   Yes [provider]  ?bisacodyl (DULCOLAX) 5 MG EC tablet Take all 4 tablets at 8 am the morning prior to your surgery. 03/07/22   Ronny Bacon, MD  ?Cyanocobalamin (B-12) 1000 MCG TABS Take 1,000 mcg by mouth 4 (four) times a week. 01/24/18   [provider]  ?ketotifen (ZADITOR) 0.025 % ophthalmic solution Place 1 drop into both eyes at bedtime as needed (allergies).    [provider]  ?metroNIDAZOLE (FLAGYL) 500 MG tablet Take 2 tablets at 8AM, take 2 tablets at [facility], and take 2 tablets at 8PM the day prior to your surgery 03/07/22   Ronny Bacon, MD  ?neomycin (MYCIFRADIN) 500 MG tablet Take 2 tablet at 8am, take 2 tablets at 2pm, and take 2 tablets at 8pm the day prior to your surgery 03/07/22   Ronny Bacon, MD  ?polyethylene glycol powder (MIRALAX) 17 GM/SCOOP powder Mix full container in 64 ounces of Gatorade or other clear liquid. NO Red 03/07/22   Ronny Bacon, MD  ? ? ?Allergies as of 03/07/2022 - Review Complete 03/07/2022  ?Allergen Reaction Noted  ? Cefdinir Rash 08/15/2021  ? ? ?Family History  ?Problem Relation Age of Onset  ? Colon cancer Neg Hx   ? Breast cancer Mother   ? Brain cancer Maternal Grandfather   ? Other Brother   ?     rectal tumor   ? ? ?Social History  ? ?  Socioeconomic History  ? Marital status: Married  ?  Spouse name: Not on file  ? Number of children: Not on file  ? Years of education: Not on file  ? Highest education level: Not on file  ?Occupational History  ? Not on file  ?Tobacco Use  ? Smoking status: Never  ? Smokeless tobacco: Never  ?Vaping Use  ? Vaping Use: Never used  ?Substance and Sexual Activity  ? Alcohol use: No  ?  Alcohol/week: 0.0 standard drinks  ? Drug use: No  ? Sexual activity: Not on file  ?Other Topics Concern  ? Not on file  ?Social History Narrative  ? Not on file  ? ?Social Determinants of Health  ? ?Financial Resource Strain: Not on file  ?Food Insecurity: Not on file  ?Transportation Needs:  Not on file  ?Physical Activity: Not on file  ?Stress: Not on file  ?Social Connections: Not on file  ?Intimate Partner Violence: Not on file  ? ? ?Review of Systems: ?See HPI, otherwise negative ROS ? ?Physical Exam: ?BP (!) 148/80   Pulse 61   Temp 98 ?F (36.7 ?C) (Temporal)   Resp (!) 21   Ht 5' 5.5" (1.664 m)   Wt 93.4 kg   SpO2 99%   BMI 33.76 kg/m?  ?General:   Alert,  pleasant and cooperative in NAD ?Head:  Normocephalic and atraumatic. ?Neck:  Supple; no masses or thyromegaly. ?Lungs:  Clear throughout to auscultation.    ?Heart:  Regular rate and rhythm. ?Abdomen:  Soft, nontender and nondistended. Normal bowel sounds, without guarding, and without rebound.   ?Neurologic:  Alert and  oriented x4;  grossly normal neurologically. ? ?Impression/Plan: ?Abigail Mitchell is here for an colonoscopy to be performed for diverticulitis ? ?Risks, benefits, limitations, and alternatives regarding  colonoscopy have been reviewed with the patient.  Questions have been answered.  All parties agreeable. ? ? ?[name], MD  03/10/2022, 10:05 AM ?

## 2022-03-10 NOTE — Anesthesia Preprocedure Evaluation (Signed)
Anesthesia Evaluation  ?Patient identified by MRN, date of birth, ID band ?Patient awake ? ? ? ?Reviewed: ?Allergy & Precautions, H&P , NPO status , Patient's Chart, lab work & pertinent test results ? ?Airway ?Mallampati: II ? ?TM Distance: >3 FB ?Neck ROM: full ? ? ? Dental ?no notable dental hx. ? ?  ?Pulmonary ?sleep apnea ,  ?  ?Pulmonary exam normal ?breath sounds clear to auscultation ? ? ? ? ? ? Cardiovascular ?Normal cardiovascular exam ?Rhythm:regular Rate:Normal ? ? ?  ?Neuro/Psych ?  ? GI/Hepatic ?  ?Endo/Other  ?Hypothyroidism overweight ? Renal/GU ?Renal disease  ? ?  ?Musculoskeletal ? ? Abdominal ?  ?Peds ? Hematology ?  ?Anesthesia Other Findings ? ? Reproductive/Obstetrics ? ?  ? ? ? ? ? ? ? ? ? ? ? ? ? ?  ?  ? ? ? ? ? ? ? ? ?Anesthesia Physical ?Anesthesia Plan ? ?ASA: 2 ? ?Anesthesia Plan: General  ? ?Post-op Pain Management: Minimal or no pain anticipated  ? ?Induction: Intravenous ? ?PONV Risk Score and Plan: 3 and Treatment may vary due to age or medical condition, Propofol infusion and TIVA ? ?Airway Management Planned: Natural Airway ? ?Additional Equipment:  ? ?Intra-op Plan:  ? ?Post-operative Plan:  ? ?Informed Consent: I have reviewed the patients History and Physical, chart, labs and discussed the procedure including the risks, benefits and alternatives for the proposed anesthesia with the patient or authorized representative who has indicated his/her understanding and acceptance.  ? ? ? ?Dental Advisory Given ? ?Plan Discussed with: CRNA ? ?Anesthesia Plan Comments:   ? ? ? ? ? ? ?Anesthesia Quick Evaluation ? ?

## 2022-03-10 NOTE — Transfer of Care (Signed)
Immediate Anesthesia Transfer of Care Note ? ?Patient: Abigail Mitchell ? ?Procedure(s) Performed: COLONOSCOPY WITH PROPOFOL (Rectum) ? ?Patient Location: PACU ? ?Anesthesia Type: General ? ?Level of Consciousness: awake, alert  and patient cooperative ? ?Airway and Oxygen Therapy: Patient Spontanous Breathing and Patient connected to supplemental oxygen ? ?Post-op Assessment: Post-op Vital signs reviewed, Patient's Cardiovascular Status Stable, Respiratory Function Stable, Patent Airway and No signs of Nausea or vomiting ? ?Post-op Vital Signs: Reviewed and stable ? ?Complications: No notable events documented. ? ?

## 2022-03-10 NOTE — Anesthesia Postprocedure Evaluation (Signed)
Anesthesia Post Note ? ?Patient: Abigail Mitchell ? ?Procedure(s) Performed: COLONOSCOPY WITH PROPOFOL (Rectum) ? ? ?  ?Patient location during evaluation: PACU ?Anesthesia Type: General ?Level of consciousness: awake and alert and oriented ?Pain management: satisfactory to patient ?Vital Signs Assessment: post-procedure vital signs reviewed and stable ?Respiratory status: spontaneous breathing, nonlabored ventilation and respiratory function stable ?Cardiovascular status: blood pressure returned to baseline and stable ?Postop Assessment: Adequate PO intake and No signs of nausea or vomiting ?Anesthetic complications: no ? ? ?No notable events documented. ? ?Raliegh Ip ? ? ? ? ? ?

## 2022-03-13 ENCOUNTER — Encounter: Payer: Self-pay | Admitting: Gastroenterology

## 2022-03-13 ENCOUNTER — Ambulatory Visit: Payer: Self-pay | Admitting: Surgery

## 2022-03-13 ENCOUNTER — Encounter
Admission: RE | Admit: 2022-03-13 | Discharge: 2022-03-13 | Disposition: A | Discharge status: Home / Self Care | Payer: PPO | Source: Ambulatory Visit | Attending: Surgery | Admitting: Surgery

## 2022-03-13 VITALS — Ht 66.5 in | Wt 210.0 lb

## 2022-03-13 DIAGNOSIS — Z01812 Encounter for preprocedural laboratory examination: Secondary | ICD-10-CM

## 2022-03-13 DIAGNOSIS — Z933 Colostomy status: Secondary | ICD-10-CM

## 2022-03-13 DIAGNOSIS — N1831 Chronic kidney disease, stage 3a: Secondary | ICD-10-CM

## 2022-03-13 HISTORY — DX: Diverticulitis of intestine, part unspecified, without perforation or abscess without bleeding: K57.92

## 2022-03-13 HISTORY — DX: Hyperlipidemia, unspecified: E78.5

## 2022-03-13 HISTORY — DX: Chronic kidney disease, stage 3a: N18.31

## 2022-03-13 HISTORY — DX: Vitamin D deficiency, unspecified: E55.9

## 2022-03-13 LAB — SURGICAL PATHOLOGY

## 2022-03-13 NOTE — Patient Instructions (Addendum)
Your procedure is scheduled on: Monday, April 24 ?Report to the Registration Desk on the 1st floor of the [facility]. ?To find out your arrival time, please call [phone] between 1PM - 3PM on: Friday, April 21 ? ?REMEMBER: ?Instructions that are not followed completely may result in serious medical risk, up to and including death; or upon the discretion of your surgeon and anesthesiologist your surgery may need to be rescheduled. ? ?Do not eat food after midnight the night before surgery.  ?No gum chewing, lozengers or hard candies. ? ?You may however, drink CLEAR liquids up to 2 hours before you are scheduled to arrive for your surgery. Do not drink anything within 2 hours of your scheduled arrival time. ? ?Clear liquids include: ?- water  ?- apple juice without pulp ?- gatorade (not RED colors) ?- black coffee or tea (Do NOT add milk or creamers to the coffee or tea) ?Do NOT drink anything that is not on this list. ? ?Follow diet restrictions and bowel prep given to you by Dr. Christian Mate. ? ?TAKE THESE MEDICATIONS THE MORNING OF SURGERY WITH A SIP OF WATER: ? ?Levothyroxine ?Rosuvastatin ? ?One week prior to surgery: starting April 17 ?Stop Anti-inflammatories (NSAIDS) such as Advil, Aleve, Ibuprofen, Motrin, Naproxen, Naprosyn and Aspirin based products such as Excedrin, Goodys Powder, BC Powder. ?Stop ANY OVER THE COUNTER supplements until after surgery. ?You may however, continue to take Tylenol if needed for pain up until the day of surgery. ? ?No Alcohol for 24 hours before or after surgery. ? ?No Smoking including e-cigarettes for 24 hours prior to surgery.  ?No chewable tobacco products for at least 6 hours prior to surgery.  ?No nicotine patches on the day of surgery. ? ?Do not use any "recreational" drugs for at least a week prior to your surgery.  ?Please be advised that the combination of cocaine and anesthesia may have negative outcomes, up to and including death. ?If you test positive for  cocaine, your surgery will be cancelled. ? ?On the morning of surgery brush your teeth with toothpaste and water, you may rinse your mouth with mouthwash if you wish. ?Do not swallow any toothpaste or mouthwash. ? ?Use CHG Soap as directed on instruction sheet. ? ?Do not wear jewelry, make-up, hairpins, clips or nail polish. ? ?Do not wear lotions, powders, or perfumes.  ? ?Do not shave body from the neck down 48 hours prior to surgery just in case you cut yourself which could leave a site for infection.  ?Also, freshly shaved skin may become irritated if using the CHG soap. ? ?Contact lenses, hearing aids and dentures may not be worn into surgery. ? ?Do not bring valuables to the hospital. [facility] is not responsible for any missing/lost belongings or valuables.  ? ?Notify your doctor if there is any change in your medical condition (cold, fever, infection). ? ?Wear comfortable clothing (specific to your surgery type) to the hospital. ? ?After surgery, you can help prevent lung complications by doing breathing exercises.  ?Take deep breaths and cough every 1-2 hours. Your doctor may order a device called an Incentive Spirometer to help you take deep breaths. ?When coughing or sneezing, hold a pillow firmly against your incision with both hands. This is called ?splinting.? Doing this helps protect your incision. It also decreases belly discomfort. ? ?If you are being admitted to the hospital overnight, leave your suitcase in the car. ?After surgery it may be brought to your room. ? ?If you  are being discharged the day of surgery, you will not be allowed to drive home. ?You will need a responsible adult (18 years or older) to drive you home and stay with you that night.  ? ?If you are taking public transportation, you will need to have a responsible adult (18 years or older) with you. ?Please confirm with your physician that it is acceptable to use public transportation.  ? ?Please call the [facility]  Dept. at [phone] if you have any questions about these instructions. ? ?Surgery Visitation Policy: ? ?Patients undergoing a surgery or procedure may have two family members or support persons with them as long as the person is not COVID-19 positive or experiencing its symptoms.  ? ?Inpatient Visitation:   ? ?Visiting hours are 7 a.m. to 8 p.m. ?Up to four visitors are allowed at one time in a patient room, including children. The visitors may rotate out with other people during the day. One designated support person (adult) may remain overnight.  ?

## 2022-03-14 ENCOUNTER — Encounter
Admission: RE | Admit: 2022-03-14 | Discharge: 2022-03-14 | Disposition: A | Discharge status: Home / Self Care | Payer: PPO | Source: Ambulatory Visit | Attending: Surgery | Admitting: Surgery

## 2022-03-14 DIAGNOSIS — Z933 Colostomy status: Secondary | ICD-10-CM | POA: Diagnosis not present

## 2022-03-14 DIAGNOSIS — N1831 Chronic kidney disease, stage 3a: Secondary | ICD-10-CM | POA: Diagnosis not present

## 2022-03-14 DIAGNOSIS — Z01812 Encounter for preprocedural laboratory examination: Secondary | ICD-10-CM | POA: Diagnosis not present

## 2022-03-14 LAB — COMPREHENSIVE METABOLIC PANEL
ALT: 16 U/L (ref 0–44)
AST: 21 U/L (ref 15–41)
Albumin: 4 g/dL (ref 3.5–5.0)
Alkaline Phosphatase: 57 U/L (ref 38–126)
Anion gap: 8 (ref 5–15)
BUN: 20 mg/dL (ref 8–23)
CO2: 27 mmol/L (ref 22–32)
Calcium: 9.2 mg/dL (ref 8.9–10.3)
Chloride: 106 mmol/L (ref 98–111)
Creatinine, Ser: 1.16 mg/dL — ABNORMAL HIGH (ref 0.44–1.00)
GFR, Estimated: 50 mL/min — ABNORMAL LOW (ref >60)
Glucose, Bld: 89 mg/dL (ref 70–99)
Potassium: 4.1 mmol/L (ref 3.5–5.1)
Sodium: 141 mmol/L (ref 135–145)
Total Bilirubin: 0.8 mg/dL (ref 0.3–1.2)
Total Protein: 7.3 g/dL (ref 6.5–8.1)

## 2022-03-14 LAB — CBC WITH DIFFERENTIAL/PLATELET
Abs Immature Granulocytes: 0.02 10*3/uL (ref 0.00–0.07)
Basophils Absolute: 0.1 10*3/uL (ref 0.0–0.1)
Basophils Relative: 1 %
Eosinophils Absolute: 0.1 10*3/uL (ref 0.0–0.5)
Eosinophils Relative: 2 %
HCT: 44.2 % (ref 36.0–46.0)
Hemoglobin: 14.1 g/dL (ref 12.0–15.0)
Immature Granulocytes: 0 %
Lymphocytes Relative: 27 %
Lymphs Abs: 1.4 10*3/uL (ref 0.7–4.0)
MCH: 31.3 pg (ref 26.0–34.0)
MCHC: 31.9 g/dL (ref 30.0–36.0)
MCV: 98.2 fL (ref 80.0–100.0)
Monocytes Absolute: 0.6 10*3/uL (ref 0.1–1.0)
Monocytes Relative: 12 %
Neutro Abs: 3 10*3/uL (ref 1.7–7.7)
Neutrophils Relative %: 58 %
Platelets: 224 10*3/uL (ref 150–400)
RBC: 4.5 MIL/uL (ref 3.87–5.11)
RDW: 12.6 % (ref 11.5–15.5)
WBC: 5.3 10*3/uL (ref 4.0–10.5)
nRBC: 0 % (ref 0.0–0.2)

## 2022-03-16 ENCOUNTER — Other Ambulatory Visit: Payer: Self-pay

## 2022-03-20 ENCOUNTER — Encounter: Payer: Self-pay | Admitting: Surgery

## 2022-03-20 ENCOUNTER — Other Ambulatory Visit: Payer: Self-pay

## 2022-03-20 ENCOUNTER — Inpatient Hospital Stay: Payer: PPO | Admitting: Certified Registered"

## 2022-03-20 ENCOUNTER — Inpatient Hospital Stay
Admission: RE | Admit: 2022-03-20 | Discharge: 2022-03-22 | DRG: 346 | Disposition: A | Discharge status: Home / Self Care | Payer: PPO | Attending: Surgery | Admitting: Surgery

## 2022-03-20 ENCOUNTER — Encounter
Admission: RE | Disposition: A | Discharge status: Home / Self Care | Payer: Self-pay | Source: Home / Self Care | Attending: Surgery

## 2022-03-20 DIAGNOSIS — Z9889 Other specified postprocedural states: Principal | ICD-10-CM

## 2022-03-20 DIAGNOSIS — E039 Hypothyroidism, unspecified: Secondary | ICD-10-CM | POA: Diagnosis not present

## 2022-03-20 DIAGNOSIS — Z881 Allergy status to other antibiotic agents status: Secondary | ICD-10-CM | POA: Diagnosis not present

## 2022-03-20 DIAGNOSIS — Z933 Colostomy status: Secondary | ICD-10-CM | POA: Diagnosis not present

## 2022-03-20 DIAGNOSIS — Z79890 Hormone replacement therapy (postmenopausal): Secondary | ICD-10-CM

## 2022-03-20 DIAGNOSIS — E785 Hyperlipidemia, unspecified: Secondary | ICD-10-CM | POA: Diagnosis present

## 2022-03-20 DIAGNOSIS — Z433 Encounter for attention to colostomy: Secondary | ICD-10-CM | POA: Diagnosis not present

## 2022-03-20 DIAGNOSIS — N1831 Chronic kidney disease, stage 3a: Secondary | ICD-10-CM | POA: Diagnosis present

## 2022-03-20 DIAGNOSIS — K5732 Diverticulitis of large intestine without perforation or abscess without bleeding: Secondary | ICD-10-CM

## 2022-03-20 DIAGNOSIS — D72829 Elevated white blood cell count, unspecified: Secondary | ICD-10-CM | POA: Diagnosis not present

## 2022-03-20 HISTORY — PX: APPLICATION OF WOUND VAC: SHX5189

## 2022-03-20 HISTORY — PX: XI ROBOTIC ASSISTED COLOSTOMY TAKEDOWN: SHX6828

## 2022-03-20 HISTORY — PX: CYSTOSCOPY: SHX5120

## 2022-03-20 SURGERY — CLOSURE, COLOSTOMY, ROBOT-ASSISTED
Anesthesia: General | Site: Abdomen

## 2022-03-20 MED ORDER — PROPOFOL 10 MG/ML IV BOLUS
INTRAVENOUS | Status: AC
  Filled 2022-03-20: qty 20

## 2022-03-20 MED ORDER — ALVIMOPAN 12 MG PO CAPS
ORAL_CAPSULE | ORAL | Status: AC
  Administered 2022-03-20: 12 mg via ORAL
  Filled 2022-03-20: qty 1

## 2022-03-20 MED ORDER — ALVIMOPAN 12 MG PO CAPS: 12.0000 mg | ORAL_CAPSULE | ORAL | Status: AC

## 2022-03-20 MED ORDER — METRONIDAZOLE 500 MG/100ML IV SOLN
500.0000 mg | Freq: Two times a day (BID) | INTRAVENOUS | Status: AC
  Administered 2022-03-21: 500 mg via INTRAVENOUS
  Filled 2022-03-20 (×2): qty 100

## 2022-03-20 MED ORDER — ONDANSETRON 4 MG PO TBDP: 4.0000 mg | ORAL_TABLET | Freq: Four times a day (QID) | ORAL | Status: DC | PRN

## 2022-03-20 MED ORDER — MIDAZOLAM HCL 2 MG/2ML IJ SOLN
INTRAMUSCULAR | Status: AC
  Filled 2022-03-20: qty 2

## 2022-03-20 MED ORDER — ONDANSETRON HCL 4 MG/2ML IJ SOLN: 4.0000 mg | Freq: Once | INTRAMUSCULAR | Status: DC | PRN

## 2022-03-20 MED ORDER — BUPIVACAINE-EPINEPHRINE (PF) 0.25% -1:200000 IJ SOLN
INTRAMUSCULAR | Status: DC | PRN
  Administered 2022-03-20: 27 mL

## 2022-03-20 MED ORDER — LIDOCAINE HCL (CARDIAC) PF 100 MG/5ML IV SOSY
PREFILLED_SYRINGE | INTRAVENOUS | Status: DC | PRN
  Administered 2022-03-20: 80 mg via INTRAVENOUS

## 2022-03-20 MED ORDER — CIPROFLOXACIN IN D5W 400 MG/200ML IV SOLN
400.0000 mg | Freq: Two times a day (BID) | INTRAVENOUS | Status: AC
  Filled 2022-03-20 (×2): qty 200

## 2022-03-20 MED ORDER — ROCURONIUM BROMIDE 10 MG/ML (PF) SYRINGE
PREFILLED_SYRINGE | INTRAVENOUS | Status: AC
Start: 2022-03-20 — End: ?
  Filled 2022-03-20: qty 10

## 2022-03-20 MED ORDER — LACTATED RINGERS IV SOLN: INTRAVENOUS | Status: DC

## 2022-03-20 MED ORDER — ONDANSETRON HCL 4 MG/2ML IJ SOLN: 4.0000 mg | Freq: Four times a day (QID) | INTRAMUSCULAR | Status: DC | PRN

## 2022-03-20 MED ORDER — LIDOCAINE HCL (PF) 2 % IJ SOLN
INTRAMUSCULAR | Status: AC
  Filled 2022-03-20: qty 5

## 2022-03-20 MED ORDER — PHENYLEPHRINE 80 MCG/ML (10ML) SYRINGE FOR IV PUSH (FOR BLOOD PRESSURE SUPPORT)
PREFILLED_SYRINGE | INTRAVENOUS | Status: DC | PRN
  Administered 2022-03-20 (×2): 160 ug via INTRAVENOUS
  Administered 2022-03-20: 80 ug via INTRAVENOUS
  Administered 2022-03-20 (×3): 160 ug via INTRAVENOUS
  Administered 2022-03-20: 80 ug via INTRAVENOUS

## 2022-03-20 MED ORDER — GABAPENTIN 300 MG PO CAPS
ORAL_CAPSULE | ORAL | Status: AC
Start: 2022-03-20 — End: 2022-03-20
  Administered 2022-03-20: 300 mg via ORAL
  Filled 2022-03-20: qty 1

## 2022-03-20 MED ORDER — FAMOTIDINE 20 MG PO TABS
20.0000 mg | ORAL_TABLET | Freq: Once | ORAL | Status: AC
Start: 2022-03-20 — End: 2022-03-20

## 2022-03-20 MED ORDER — INDOCYANINE GREEN 25 MG IV SOLR
INTRAVENOUS | Status: DC | PRN
  Administered 2022-03-20 (×3): 2.5 mg via INTRAVENOUS

## 2022-03-20 MED ORDER — SODIUM CHLORIDE 0.9 % IV SOLN: INTRAVENOUS | Status: DC

## 2022-03-20 MED ORDER — BUPIVACAINE-EPINEPHRINE (PF) 0.25% -1:200000 IJ SOLN
INTRAMUSCULAR | Status: AC
  Filled 2022-03-20: qty 30

## 2022-03-20 MED ORDER — KETOTIFEN FUMARATE 0.025 % OP SOLN: 1.0000 [drp] | Freq: Every evening | OPHTHALMIC | Status: DC | PRN

## 2022-03-20 MED ORDER — ACETAMINOPHEN 500 MG PO TABS
ORAL_TABLET | ORAL | Status: AC
  Administered 2022-03-20: 1000 mg via ORAL
  Filled 2022-03-20: qty 2

## 2022-03-20 MED ORDER — PANTOPRAZOLE SODIUM 40 MG IV SOLR
40.0000 mg | Freq: Every day | INTRAVENOUS | Status: DC
  Administered 2022-03-20 – 2022-03-21 (×2): 40 mg via INTRAVENOUS
  Filled 2022-03-20 (×2): qty 10

## 2022-03-20 MED ORDER — CIPROFLOXACIN IN D5W 400 MG/200ML IV SOLN
INTRAVENOUS | Status: AC
  Administered 2022-03-21: 400 mg via INTRAVENOUS
  Filled 2022-03-20: qty 200

## 2022-03-20 MED ORDER — ONDANSETRON HCL 4 MG/2ML IJ SOLN
INTRAMUSCULAR | Status: AC
Start: 2022-03-20 — End: ?
  Filled 2022-03-20: qty 2

## 2022-03-20 MED ORDER — PROPOFOL 10 MG/ML IV BOLUS
INTRAVENOUS | Status: DC | PRN
  Administered 2022-03-20: 150 mg via INTRAVENOUS

## 2022-03-20 MED ORDER — ROSUVASTATIN CALCIUM 10 MG PO TABS
10.0000 mg | ORAL_TABLET | Freq: Every day | ORAL | Status: DC
  Administered 2022-03-21 – 2022-03-22 (×2): 10 mg via ORAL
  Filled 2022-03-20 (×2): qty 1

## 2022-03-20 MED ORDER — CIPROFLOXACIN IN D5W 400 MG/200ML IV SOLN
400.0000 mg | INTRAVENOUS | Status: AC
  Administered 2022-03-20: 400 mg via INTRAVENOUS

## 2022-03-20 MED ORDER — INDOCYANINE GREEN 25 MG IV SOLR
INTRAVENOUS | Status: DC | PRN
  Administered 2022-03-20: 50 mg

## 2022-03-20 MED ORDER — CELECOXIB 200 MG PO CAPS: 200.0000 mg | ORAL_CAPSULE | ORAL | Status: AC

## 2022-03-20 MED ORDER — EPHEDRINE 5 MG/ML INJ
INTRAVENOUS | Status: AC
  Filled 2022-03-20: qty 5

## 2022-03-20 MED ORDER — HYDROMORPHONE HCL 1 MG/ML IJ SOLN: 0.5000 mg | INTRAMUSCULAR | Status: DC | PRN

## 2022-03-20 MED ORDER — ROCURONIUM BROMIDE 10 MG/ML (PF) SYRINGE
PREFILLED_SYRINGE | INTRAVENOUS | Status: AC
  Filled 2022-03-20: qty 10

## 2022-03-20 MED ORDER — CHLORHEXIDINE GLUCONATE 0.12 % MT SOLN
OROMUCOSAL | Status: AC
  Administered 2022-03-20: 15 mL
  Filled 2022-03-20: qty 15

## 2022-03-20 MED ORDER — SODIUM CHLORIDE 0.9 % IR SOLN
Status: DC | PRN
  Administered 2022-03-20: 100 mL

## 2022-03-20 MED ORDER — FAMOTIDINE 20 MG PO TABS
ORAL_TABLET | ORAL | Status: AC
  Administered 2022-03-20: 20 mg via ORAL
  Filled 2022-03-20: qty 1

## 2022-03-20 MED ORDER — SODIUM CHLORIDE 0.9 % IR SOLN
Status: DC | PRN
  Administered 2022-03-20 (×2): 3000 mL

## 2022-03-20 MED ORDER — CELECOXIB 200 MG PO CAPS
ORAL_CAPSULE | ORAL | Status: AC
  Administered 2022-03-20: 200 mg via ORAL
  Filled 2022-03-20: qty 1

## 2022-03-20 MED ORDER — MONTELUKAST SODIUM 10 MG PO TABS
10.0000 mg | ORAL_TABLET | Freq: Every day | ORAL | Status: DC
  Administered 2022-03-20 – 2022-03-21 (×2): 10 mg via ORAL
  Filled 2022-03-20 (×2): qty 1

## 2022-03-20 MED ORDER — METRONIDAZOLE 500 MG/100ML IV SOLN
500.0000 mg | INTRAVENOUS | Status: AC
  Administered 2022-03-20: 500 mg via INTRAVENOUS
  Filled 2022-03-20: qty 100

## 2022-03-20 MED ORDER — ROCURONIUM BROMIDE 100 MG/10ML IV SOLN
INTRAVENOUS | Status: DC | PRN
  Administered 2022-03-20: 20 mg via INTRAVENOUS
  Administered 2022-03-20: 40 mg via INTRAVENOUS
  Administered 2022-03-20: 20 mg via INTRAVENOUS
  Administered 2022-03-20: 60 mg via INTRAVENOUS
  Administered 2022-03-20: 30 mg via INTRAVENOUS
  Administered 2022-03-20 (×2): 20 mg via INTRAVENOUS

## 2022-03-20 MED ORDER — SUGAMMADEX SODIUM 200 MG/2ML IV SOLN
INTRAVENOUS | Status: DC | PRN
  Administered 2022-03-20: 300 mg via INTRAVENOUS

## 2022-03-20 MED ORDER — ALVIMOPAN 12 MG PO CAPS
12.0000 mg | ORAL_CAPSULE | Freq: Two times a day (BID) | ORAL | Status: DC
  Administered 2022-03-21 (×2): 12 mg via ORAL
  Filled 2022-03-20 (×3): qty 1

## 2022-03-20 MED ORDER — BUPIVACAINE LIPOSOME 1.3 % IJ SUSP: 20.0000 mL | Freq: Once | INTRAMUSCULAR | Status: DC

## 2022-03-20 MED ORDER — FLUTICASONE PROPIONATE 50 MCG/ACT NA SUSP: 1.0000 | Freq: Every evening | NASAL | Status: DC | PRN

## 2022-03-20 MED ORDER — GLYCOPYRROLATE 0.2 MG/ML IJ SOLN
INTRAMUSCULAR | Status: DC | PRN
  Administered 2022-03-20: .2 mg via INTRAVENOUS

## 2022-03-20 MED ORDER — FENTANYL CITRATE (PF) 250 MCG/5ML IJ SOLN
INTRAMUSCULAR | Status: AC
  Filled 2022-03-20: qty 5

## 2022-03-20 MED ORDER — ACETAMINOPHEN 500 MG PO TABS: 1000.0000 mg | ORAL_TABLET | ORAL | Status: AC

## 2022-03-20 MED ORDER — LEVOTHYROXINE SODIUM 50 MCG PO TABS
50.0000 ug | ORAL_TABLET | Freq: Every day | ORAL | Status: DC
  Administered 2022-03-21 – 2022-03-22 (×2): 50 ug via ORAL
  Filled 2022-03-20 (×2): qty 1

## 2022-03-20 MED ORDER — GABAPENTIN 300 MG PO CAPS: 300.0000 mg | ORAL_CAPSULE | ORAL | Status: AC

## 2022-03-20 MED ORDER — CHLORHEXIDINE GLUCONATE CLOTH 2 % EX PADS: 6.0000 | MEDICATED_PAD | Freq: Once | CUTANEOUS | Status: DC

## 2022-03-20 MED ORDER — FENTANYL CITRATE (PF) 100 MCG/2ML IJ SOLN: 25.0000 ug | INTRAMUSCULAR | Status: DC | PRN

## 2022-03-20 MED ORDER — EPHEDRINE SULFATE (PRESSORS) 50 MG/ML IJ SOLN
INTRAMUSCULAR | Status: DC | PRN
  Administered 2022-03-20: 10 mg via INTRAVENOUS
  Administered 2022-03-20: 5 mg via INTRAVENOUS
  Administered 2022-03-20: 10 mg via INTRAVENOUS

## 2022-03-20 MED ORDER — 0.9 % SODIUM CHLORIDE (POUR BTL) OPTIME
TOPICAL | Status: DC | PRN
  Administered 2022-03-20: 500 mL

## 2022-03-20 MED ORDER — DEXAMETHASONE SODIUM PHOSPHATE 10 MG/ML IJ SOLN
INTRAMUSCULAR | Status: DC | PRN
  Administered 2022-03-20: 10 mg via INTRAVENOUS

## 2022-03-20 MED ORDER — DEXAMETHASONE SODIUM PHOSPHATE 10 MG/ML IJ SOLN
INTRAMUSCULAR | Status: AC
Start: 2022-03-20 — End: ?
  Filled 2022-03-20: qty 1

## 2022-03-20 MED ORDER — FENTANYL CITRATE (PF) 100 MCG/2ML IJ SOLN
INTRAMUSCULAR | Status: DC | PRN
  Administered 2022-03-20 (×4): 25 ug via INTRAVENOUS
  Administered 2022-03-20 (×2): 75 ug via INTRAVENOUS

## 2022-03-20 MED ORDER — MIDAZOLAM HCL 2 MG/2ML IJ SOLN
INTRAMUSCULAR | Status: DC | PRN
  Administered 2022-03-20: 2 mg via INTRAVENOUS

## 2022-03-20 SURGICAL SUPPLY — 111 items
ADH SKN CLS APL DERMABOND .7 (GAUZE/BANDAGES/DRESSINGS) ×3
APL PRP STRL LF DISP 70% ISPRP (MISCELLANEOUS)
APPLICATOR SWAB PROCTO LG 16IN (MISCELLANEOUS) ×1 IMPLANT
BAG DRAIN CYSTO-URO LG1000N (MISCELLANEOUS) ×4 IMPLANT
BLADE SURG SZ10 CARB STEEL (BLADE) ×5 IMPLANT
CANNULA REDUC XI 12-8 STAPL (CANNULA) ×4
CANNULA REDUCER 12-8 DVNC XI (CANNULA) ×4 IMPLANT
CATH FOLEY 2WAY  5CC 16FR (CATHETERS)
CATH FOLEY 2WAY 5CC 16FR (CATHETERS)
CATH URETL OPEN 5X70 (CATHETERS) ×5 IMPLANT
CATH URTH 16FR FL 2W BLN LF (CATHETERS) ×4 IMPLANT
CHLORAPREP W/TINT 26 (MISCELLANEOUS) ×4 IMPLANT
COVER TIP SHEARS 8 DVNC (MISCELLANEOUS) ×4 IMPLANT
COVER TIP SHEARS 8MM DA VINCI (MISCELLANEOUS) ×4
DERMABOND ADVANCED (GAUZE/BANDAGES/DRESSINGS) ×1
DERMABOND ADVANCED .7 DNX12 (GAUZE/BANDAGES/DRESSINGS) ×4 IMPLANT
DRAPE ARM DVNC X/XI (DISPOSABLE) ×16 IMPLANT
DRAPE COLUMN DVNC XI (DISPOSABLE) ×4 IMPLANT
DRAPE DA VINCI XI ARM (DISPOSABLE) ×16
DRAPE DA VINCI XI COLUMN (DISPOSABLE) ×4
DRAPE LEGGINS SURG 28X43 STRL (DRAPES) ×5 IMPLANT
DRAPE UNDER BUTTOCK W/FLU (DRAPES) ×5 IMPLANT
DRSG TEGADERM 2-3/8X2-3/4 SM (GAUZE/BANDAGES/DRESSINGS) ×4 IMPLANT
ELECT CAUTERY BLADE 6.4 (BLADE) ×1 IMPLANT
ELECT REM PT RETURN 9FT ADLT (ELECTROSURGICAL) ×4
ELECTRODE REM PT RTRN 9FT ADLT (ELECTROSURGICAL) ×4 IMPLANT
GAUZE 4X4 16PLY ~~LOC~~+RFID DBL (SPONGE) ×8 IMPLANT
GAUZE PACKING 1/4 X5 YD (GAUZE/BANDAGES/DRESSINGS) ×1 IMPLANT
GELPOINT ADV PLATFORM (ENDOMECHANICALS)
GLOVE ORTHO TXT STRL SZ7.5 (GLOVE) ×20 IMPLANT
GLOVE SURG UNDER POLY LF SZ7.5 (GLOVE) ×5 IMPLANT
GOWN STRL REUS W/ TWL LRG LVL3 (GOWN DISPOSABLE) ×20 IMPLANT
GOWN STRL REUS W/ TWL XL LVL3 (GOWN DISPOSABLE) ×8 IMPLANT
GOWN STRL REUS W/TWL LRG LVL3 (GOWN DISPOSABLE) ×20
GOWN STRL REUS W/TWL XL LVL3 (GOWN DISPOSABLE) ×8
GRASPER SUT TROCAR 14GX15 (MISCELLANEOUS) IMPLANT
GUIDEWIRE STR DUAL SENSOR (WIRE) ×1 IMPLANT
HANDLE YANKAUER SUCT BULB TIP (MISCELLANEOUS) ×5 IMPLANT
IRRIGATION STRYKERFLOW (MISCELLANEOUS) IMPLANT
IRRIGATOR STRYKERFLOW (MISCELLANEOUS) ×4
IV NS IRRIG 3000ML ARTHROMATIC (IV SOLUTION) ×5 IMPLANT
KIT IMAGING PINPOINTPAQ (MISCELLANEOUS) ×6 IMPLANT
KIT PINK PAD W/HEAD ARE REST (MISCELLANEOUS) ×4
KIT PINK PAD W/HEAD ARM REST (MISCELLANEOUS) ×4 IMPLANT
KIT PREVENA INCISION MGT 13 (CANNISTER) ×1 IMPLANT
MANIFOLD NEPTUNE II (INSTRUMENTS) ×10 IMPLANT
NDL INSUFFLATION 14GA 120MM (NEEDLE) ×4 IMPLANT
NEEDLE HYPO 22GX1.5 SAFETY (NEEDLE) ×5 IMPLANT
NEEDLE INSUFFLATION 14GA 120MM (NEEDLE) ×4 IMPLANT
PACK COLON CLEAN CLOSURE (MISCELLANEOUS) ×4 IMPLANT
PACK CYSTO AR (MISCELLANEOUS) ×5 IMPLANT
PACK LAP CHOLECYSTECTOMY (MISCELLANEOUS) ×5 IMPLANT
PAD PREP 24X41 OB/GYN DISP (PERSONAL CARE ITEMS) ×5 IMPLANT
PENCIL ELECTRO HAND CTR (MISCELLANEOUS) IMPLANT
PLATFORM STD W/COL CELL SVR (ENDOMECHANICALS) IMPLANT
RELOAD STAPLE 45 3.5 BLU DVNC (STAPLE) IMPLANT
RELOAD STAPLE 60 3.5 BLU DVNC (STAPLE) IMPLANT
RELOAD STAPLER 3.5X45 BLU DVNC (STAPLE) IMPLANT
RELOAD STAPLER 3.5X60 BLU DVNC (STAPLE) IMPLANT
RETRACTOR WOUND ALXS 18CM MED (MISCELLANEOUS) IMPLANT
RTRCTR WOUND ALEXIS O 18CM MED (MISCELLANEOUS)
SCISSORS METZENBAUM CVD 33 (INSTRUMENTS) ×1 IMPLANT
SEAL CANN UNIV 5-8 DVNC XI (MISCELLANEOUS) ×12 IMPLANT
SEAL XI 5MM-8MM UNIVERSAL (MISCELLANEOUS) ×12
SEALER VESSEL DA VINCI XI (MISCELLANEOUS) ×4
SEALER VESSEL EXT DVNC XI (MISCELLANEOUS) IMPLANT
SET CYSTO W/LG BORE CLAMP LF (SET/KITS/TRAYS/PACK) ×5 IMPLANT
SOL PREP PVP 2OZ (MISCELLANEOUS) ×4
SOLUTION ELECTROLUBE (MISCELLANEOUS) ×5 IMPLANT
SOLUTION PREP PVP 2OZ (MISCELLANEOUS) ×4 IMPLANT
SPIKE FLUID TRANSFER (MISCELLANEOUS) ×5 IMPLANT
SPONGE GAUZE 2X2 8PLY STRL LF (GAUZE/BANDAGES/DRESSINGS) ×4 IMPLANT
SPONGE T-LAP 18X18 ~~LOC~~+RFID (SPONGE) ×5 IMPLANT
STAPLER 45 DA VINCI SURE FORM (STAPLE)
STAPLER 45 SUREFORM DVNC (STAPLE) IMPLANT
STAPLER 60 DA VINCI SURE FORM (STAPLE)
STAPLER 60 SUREFORM DVNC (STAPLE) IMPLANT
STAPLER CANNULA SEAL DVNC XI (STAPLE) ×4 IMPLANT
STAPLER CANNULA SEAL XI (STAPLE) ×4
STAPLER CIRCULAR MANUAL XL 25 (STAPLE) ×1 IMPLANT
STAPLER RELOAD 3.5X45 BLU DVNC (STAPLE)
STAPLER RELOAD 3.5X45 BLUE (STAPLE)
STAPLER RELOAD 3.5X60 BLU DVNC (STAPLE)
STAPLER RELOAD 3.5X60 BLUE (STAPLE)
STAPLER SKIN PROX 35W (STAPLE) ×1 IMPLANT
SURGILUBE 2OZ TUBE FLIPTOP (MISCELLANEOUS) ×10 IMPLANT
SUT DVC VLOC 90 3-0 CV23 VLT (SUTURE) ×4
SUT MNCRL 4-0 (SUTURE) ×4
SUT MNCRL 4-0 27XMFL (SUTURE) ×3
SUT PDS AB 0 CT1 27 (SUTURE) ×1 IMPLANT
SUT SILK 0 (SUTURE) ×4
SUT SILK 0 30XBRD TIE 6 (SUTURE) IMPLANT
SUT SILK 2 0 SH (SUTURE) ×1 IMPLANT
SUT SILK 3 0 (SUTURE) ×4
SUT SILK 3-0 18XBRD TIE 12 (SUTURE) IMPLANT
SUT V-LOC 90 ABS 3-0 VLT  V-20 (SUTURE)
SUT V-LOC 90 ABS 3-0 VLT V-20 (SUTURE) IMPLANT
SUT VIC AB 3-0 SH 27 (SUTURE) ×4
SUT VIC AB 3-0 SH 27X BRD (SUTURE) ×4 IMPLANT
SUTURE DVC VLC 90 3-0 CV23 VLT (SUTURE) IMPLANT
SUTURE MNCRL 4-0 27XMF (SUTURE) ×4 IMPLANT
SYR 10ML LL (SYRINGE) ×10 IMPLANT
SYR 20ML LL LF (SYRINGE) ×5 IMPLANT
SYS TROCAR 1.5-3 SLV ABD GEL (ENDOMECHANICALS) ×4
SYSTEM TROCR 1.5-3 SLV ABD GEL (ENDOMECHANICALS) IMPLANT
TRAY FOLEY MTR SLVR 16FR STAT (SET/KITS/TRAYS/PACK) ×5 IMPLANT
TROCAR Z-THREAD FIOS 11X100 BL (TROCAR) ×5 IMPLANT
TROCAR Z-THREAD OPTICAL 5X100M (TROCAR) IMPLANT
TUBING EVAC SMOKE HEATED PNEUM (TUBING) ×5 IMPLANT
WATER STERILE IRR 1000ML POUR (IV SOLUTION) ×5 IMPLANT
WATER STERILE IRR 500ML POUR (IV SOLUTION) ×10 IMPLANT

## 2022-03-20 NOTE — Op Note (Signed)
Robotic assisted laparoscopic colostomy resection, extensive lysis of adhesions with splenic flexure mobilization and coloproctostomy. ? ?Pre-operative Diagnosis: Colostomy status. ? ?Post-operative Diagnosis: same.   ? ?Surgeon: Ronny Bacon, M.D., FACS ? ?Assistant: Otho Ket, PA-C.   ?(a first assist /second surgeon was necessary due to the technical complexity of the case as well as the need for assistance with EEA anastomosis.) ? ?Anesthesia: General endotracheal ? ?Findings: Extensive intra-abdominal adhesions, negative leak test on 2 separate trials.  ICG confirming adequate vascularity of distal colon prior to anastomosis. ? ?Estimated Blood Loss: 250 mL ?        ?Specimens: Colostomy    ?      ?Complications: none ?        ?     ?Procedure Details  ?The patient was seen again in the Holding Room. The benefits, complications, treatment options, and expected outcomes were discussed with the patient. The risks of bleeding, infection, recurrence of symptoms, failure to resolve symptoms, unanticipated injury, prosthetic placement, prosthetic infection, any of which could require further surgery were reviewed with the patient. The likelihood of improving the patient's symptoms with return to their baseline status is expected.  The patient and/or family concurred with the proposed plan, giving informed consent.  The patient was taken to Operating Room, identified and the procedure verified.   ? ?Prior to the induction of general anesthesia, antibiotic prophylaxis was administered. VTE prophylaxis was in place.  General anesthesia was then administered and tolerated well. After the induction, the patient was positioned in the lithotomy position, and after the cystoscopy and ICG instillation, the abdomen and perineum was prepped with Betadine and Chloraprep and draped in the sterile fashion.  ?A Time Out was held and the above information confirmed. ?After local infiltration of quarter percent Marcaine with  epinephrine, stab incision was made left upper quadrant.  Just below the costal margin approximately midclavicular line the Veress needle is passed with sensation of the layers to penetrate the abdominal wall and into the peritoneum.  Saline drop test is confirmed peritoneal placement.  Insufflation is initiated with carbon dioxide to pressures of 15 mmHg.  ?While her abdomen was insufflated I proceeded with excising the previously closed colostomy by incising the surrounding skin, and caring the sharp dissection with electrocautery through the subcu tissues, and freeing it up from the underlying fascia and entering the peritoneum where it was freed from the adjacent adhesions and released in the abdominal cavity. ?I then placed a small gel point at that location, utilized this access to it identify the midline adhesions.  ?Under direct visualization 2 additional 8.5 mm robotic trocars were placed in the left upper quadrant and midline upper abdomen.  Using hot scissors lysis of adhesions was completed to allow adequate access of the remaining right abdominal wall for placement of additional robotic trocars. ?We then completed lysis of adhesions to all the anterior abdominal wall adhesions.  We then placed the patient into a steep Trendelenburg with right side down and proceeded with lysing of adhesions in the pelvis.  I was able to identify the 2 Prolene sutures, marking the staple line of the proximal rectum.  Multiple small bowel adhesions throughout the pelvis were taken down to fully mobilize contents of the pelvis for eventual anastomosis.  We then repositioned the patient with head up for mobilization of the splenic flexure.  We took down the omental attachments to the distal transverse colon, and laterally mobilized the descending colon from the lateral abdominal wall peritoneal  attachments, and then completed blunt dissection of the retromesenteric retroperitoneal interface.  This allowed good mobilization  of splenic flexure and I was confident sufficient length to reach the rectum in the pelvis. ?However, I came to realize that there were other sources of preventing the distal descending colon to reach the rectum, and these were multiple adhesions to the distal descending colon and colostomy itself.  These adhesions were subsequently lysed as well to allow adequate freedom and reach to the pelvis.  ICG was utilized to confirm vascularity. ?We had multiple delays with repositioning robotic arms for adequate reach both and transitioning from the pelvis to the left upper quadrant and vice versa. ?We sized up the rectum to a comfortable size identifying the proximalmost rectum to be somewhat smaller in caliber and only accommodating a 25 mm dilator.  I then proceeded with dividing the colostomy from the distal colon, placing the 25 mm anvil and securing it with a pursestring suture of 3-0 V-Loc. ?My assistant went below and brought the stapler up from the anal canal and found an adequate location on the anterior wall of the rectum, then proceeded with advancing the spike and creating the EEA anastomosis.  Although we did not confirm to complete donuts, 2 separate occasions necessitated [place] to double check for air leak, 1 was after some manipulation of the anastomosis and felt prudent to repeat the test.  No air leak was appreciated x2. ?I then proceeded to irrigate the abdominal cavity with 3 to 6 L of normal saline solution, retrieving the colostomy segment for specimen, and ensuring removal of all needles and sponges. ?I then removed the GelPort point, reapproximated the anterior rectus sheath with running 0 PDS vertically.  Then irrigated subcutaneous tissues, confirmed adequate hemostasis.  And closed the skin loosely with staples. ?The other trocars were removed desufflated and the abdomen, and skin closed with staples.  Dressings were applied.  I applied a Praveena over wicking applied to the subcutaneous tissues of  the colostomy site.   ? ? ? ? ? ? ?Ronny Bacon M.D., FACS ?[facility] ?03/20/2022 8:16 PM  ? ?

## 2022-03-20 NOTE — Anesthesia Procedure Notes (Signed)
Procedure Name: Intubation ?Date/Time: 03/20/2022 12:34 PM ?Performed by: Esaw Grandchild, CRNA ?Pre-anesthesia Checklist: Patient identified, Emergency Drugs available, Suction available and Patient being monitored ?Patient Re-evaluated:Patient Re-evaluated prior to induction ?Oxygen Delivery Method: Circle system utilized ?Preoxygenation: Pre-oxygenation with 100% oxygen ?Induction Type: IV induction ?Ventilation: Mask ventilation without difficulty ?Laryngoscope Size: Sabra Heck and 3 ?Grade View: Grade I ?Tube type: Oral ?Tube size: 7.0 mm ?Number of attempts: 1 ?Airway Equipment and Method: Stylet, Oral airway and Bite block ?Placement Confirmation: ETT inserted through vocal cords under direct vision, positive ETCO2 and breath sounds checked- equal and bilateral ?Secured at: 21 cm ?Tube secured with: Tape ?Dental Injury: Teeth and Oropharynx as per pre-operative assessment  ? ? ? ? ?

## 2022-03-20 NOTE — H&P (Signed)
UROLOGY H&P UPDATE ? ?Agree with prior H&P by Dr. Christian Mate.  Urology requested to inject bilateral ureteral ICG to aid in intraoperative identification. ? ?Cardiac: RRR ?Lungs: CTA bilaterally ? ?Laterality: Bilateral ?Procedure: Cystoscopy, bilateral injection of ureteral ICG ? ? ?Informed consent obtained, we specifically discussed the risks of bleeding, infection, ureteral injury, AKI, post-operative pain, need for additional procedures. ? ?Abigail Co, MD ?03/20/2022 ? ?

## 2022-03-20 NOTE — Anesthesia Preprocedure Evaluation (Addendum)
Anesthesia Evaluation  ?Patient identified by MRN, date of birth, ID band ?Patient awake ? ? ? ?Reviewed: ?Allergy & Precautions, NPO status , Patient's Chart, lab work & pertinent test results ? ?History of Anesthesia Complications ?Negative for: history of anesthetic complications ? ?Airway ?Mallampati: III ? ?TM Distance: <3 FB ?Neck ROM: full ? ? ? Dental ? ?(+) Chipped, Dental Advidsory Given ?  ?Pulmonary ?neg pulmonary ROS, neg shortness of breath, neg recent URI,  ?  ?Pulmonary exam normal ? ? ? ? ? ? ? Cardiovascular ?Exercise Tolerance: Good ?(-) angina(-) Past MI negative cardio ROS ?Normal cardiovascular exam ? ? ?  ?Neuro/Psych ?negative neurological ROS ? negative psych ROS  ? GI/Hepatic ?negative GI ROS, Neg liver ROS, neg GERD  ,  ?Endo/Other  ?neg diabetesHypothyroidism  ? Renal/GU ?  ? ?  ?Musculoskeletal ? ? Abdominal ?  ?Peds ? Hematology ?negative hematology ROS ?(+)   ?Anesthesia Other Findings ?Past Medical History: ?No date: Diverticulosis ?10/12/2006: Hx of colonic polyps - diminutive adenomas ?No date: Insomnia ? ?Past Surgical History: ?No date: ABDOMINAL HYSTERECTOMY ?No date: BREAST SURGERY ?    Comment:  calcification ?No date: CESAREAN SECTION ?No date: COLONOSCOPY ?No date: TONSILLECTOMY ?No date: TUBAL LIGATION ? ?BMI   ? Body Mass Index: 34.86 kg/m?  ?  ? ? Reproductive/Obstetrics ?negative OB ROS ? ?  ? ? ? ? ? ? ? ? ? ? ? ? ? ?  ?  ? ? ? ? ? ? ? ?Anesthesia Physical ? ?Anesthesia Plan ? ?ASA: 2 ? ?Anesthesia Plan: General  ? ?Post-op Pain Management:   ? ?Induction: Intravenous ? ?PONV Risk Score and Plan: Ondansetron, Dexamethasone, Midazolam and Treatment may vary due to age or medical condition ? ?Airway Management Planned: Oral ETT ? ?Additional Equipment:  ? ?Intra-op Plan:  ? ?Post-operative Plan: Extubation in OR ? ?Informed Consent: I have reviewed the patients History and Physical, chart, labs and discussed the procedure including the  risks, benefits and alternatives for the proposed anesthesia with the patient or authorized representative who has indicated his/her understanding and acceptance.  ? ? ? ?Dental Advisory Given ? ?Plan Discussed with: Anesthesiologist, CRNA and Surgeon ? ?Anesthesia Plan Comments: (Patient consented for risks of anesthesia including but not limited to:  ?- adverse reactions to medications ?- damage to eyes, teeth, lips or other oral mucosa ?- nerve damage due to positioning  ?- sore throat or hoarseness ?- Damage to heart, brain, nerves, lungs, other parts of body or loss of life ? ?Patient voiced understanding.)  ? ? ? ? ? ?Anesthesia Quick Evaluation ? ?

## 2022-03-20 NOTE — Transfer of Care (Signed)
Immediate Anesthesia Transfer of Care Note ? ?Patient: Abigail Mitchell ? ?Procedure(s) Performed: XI ROBOTIC ASSISTED COLOSTOMY TAKEDOWN (Abdomen) ?APPLICATION OF WOUND VAC ?CYSTOSCOPY WITH ICG (Bilateral) ? ?Patient Location: PACU ? ?Anesthesia Type:General ? ?Level of Consciousness: drowsy ? ?Airway & Oxygen Therapy: Patient Spontanous Breathing and Patient connected to face mask oxygen ? ?Post-op Assessment: Report given to RN ? ?Post vital signs: stable ? ?Last Vitals:  ?Vitals Value Taken Time  ?BP    ?Temp    ?Pulse    ?Resp    ?SpO2    ? ? ?Last Pain:  ?Vitals:  ? 03/20/22 1049  ?TempSrc: Oral  ?PainSc: 1   ?   ? ?  ? ?Complications: No notable events documented. ?

## 2022-03-20 NOTE — Op Note (Signed)
Date of procedure: 03/20/22 ? ?Preoperative diagnosis:  ?Obstructing diverticulitis ?Request for ureteral ICG ? ?Postoperative diagnosis:  ?Same ? ?Procedure: ?Cystoscopy, bilateral ureteral instillation of ICG ? ?Surgeon: Nickolas Madrid, MD ? ?Anesthesia: General ? ?Complications: None ? ?Intraoperative findings:  ?Normal cystoscopy, uncomplicated injection of bilateral ureteral ICG ? ?EBL: None ? ?Specimens: None for urology portion ? ?Drains: 16 French Foley ? ?Indication: Abigail Mitchell is a 71 y.o. patient with obstructing diverticulitis who previously underwent a Hartmann procedure and presents today for takedown.  General surgery requested bilateral ureteral ICG to aid in intraoperative identification.  After reviewing the management options for treatment, they elected to proceed with the above surgical procedure(s). We have discussed the potential benefits and risks of the procedure, side effects of the proposed treatment, the likelihood of the patient achieving the goals of the procedure, and any potential problems that might occur during the procedure or recuperation. Informed consent has been obtained. ? ?Description of procedure: ? ?The patient was taken to the operating room and general anesthesia was induced. SCDs were placed for DVT prophylaxis.. The patient was placed in the dorsal lithotomy position, prepped and draped in the usual sterile fashion, and preoperative antibiotics were administered. A preoperative time-out was performed.  ? ?A 21 French rigid cystoscope was used to intubate the urethra and thorough cystoscopy was performed.  The bladder was grossly normal with no suspicious lesions, and ureteral orifices were orthotopic bilaterally. ? ?A sensor wire was used to intubate the left ureteral orifice and advanced easily up into the kidney.  A 5 French ureteral access catheter was advanced over the wire to the proximal ureter.  The wire was removed and 10 mL of ICG was gently instilled as the  catheter was withdrawn. ? ?An identical procedure was performed on the patient's right side. ? ?A 16 French Foley passed into the bladder with return of light green fluid, and 10 mL was placed in the balloon. ? ?Disposition: Continue with general surgery portion of case ? ?Plan: ?Continue with general surgery portion of case ?Foley duration per general surgery team ? ?Nickolas Madrid, MD ? ?

## 2022-03-21 ENCOUNTER — Encounter: Payer: Self-pay | Admitting: Surgery

## 2022-03-21 DIAGNOSIS — Z433 Encounter for attention to colostomy: Secondary | ICD-10-CM

## 2022-03-21 LAB — CBC
HCT: 40.8 % (ref 36.0–46.0)
Hemoglobin: 13.5 g/dL (ref 12.0–15.0)
MCH: 32.3 pg (ref 26.0–34.0)
MCHC: 33.1 g/dL (ref 30.0–36.0)
MCV: 97.6 fL (ref 80.0–100.0)
Platelets: 144 10*3/uL — ABNORMAL LOW (ref 150–400)
RBC: 4.18 MIL/uL (ref 3.87–5.11)
RDW: 12.4 % (ref 11.5–15.5)
WBC: 14.7 10*3/uL — ABNORMAL HIGH (ref 4.0–10.5)
nRBC: 0 % (ref 0.0–0.2)

## 2022-03-21 LAB — BASIC METABOLIC PANEL
Anion gap: 5 (ref 5–15)
BUN: 12 mg/dL (ref 8–23)
CO2: 22 mmol/L (ref 22–32)
Calcium: 8.4 mg/dL — ABNORMAL LOW (ref 8.9–10.3)
Chloride: 111 mmol/L (ref 98–111)
Creatinine, Ser: 1.16 mg/dL — ABNORMAL HIGH (ref 0.44–1.00)
GFR, Estimated: 50 mL/min — ABNORMAL LOW (ref >60)
Glucose, Bld: 127 mg/dL — ABNORMAL HIGH (ref 70–99)
Potassium: 4.5 mmol/L (ref 3.5–5.1)
Sodium: 138 mmol/L (ref 135–145)

## 2022-03-21 NOTE — Plan of Care (Signed)

## 2022-03-21 NOTE — Progress Notes (Addendum)
[facility] ?SURGICAL PROGRESS NOTE ? ?Hospital Day(s): 1.  ? ?Post op day(s): 1 Day Post-Op.  ? ?Interval History:  ?Patient seen and examined ?No acute events or new complaints overnight.  ?Patient reports she is feeling good; incisional soreness ?No fever, chills, nausea, emesis ?She does have a leukocytosis this morning to 14.7K; likely reactive from OR ?Hgb normal at 13.5 ?Renal function seems to be her baseline; sCr - 1.16; UO - 1750 ccs ?No electrolyte derangements  ?She is NPO ?No flatus ? ?Vital signs in last 24 hours: [min-max] current  ?Temp:  [97.1 ?F (36.2 ?C)-98.9 ?F (37.2 ?C)] 98.9 ?F (37.2 ?C) (04/25 0757) ?Pulse Rate:  [62-75] 75 (04/25 0757) ?Resp:  [11-20] 18 (04/25 0757) ?BP: (96-134)/(49-76) 105/49 (04/25 0757) ?SpO2:  [97 %-100 %] 98 % (04/25 0757) ?Weight:  [95.3 kg] 95.3 kg (04/24 1049)     Height: 5' 6.5" (168.9 cm) Weight: 95.3 kg BMI (Calculated): 33.41  ? ?Intake/Output last 2 shifts:  ?04/24 0701 - 04/25 0700 ?In: 2040 [P.O.:40; I.V.:1700; IV Piggyback:300] ?Out: 1750 [AJOIN:8676]  ? ?Physical Exam:  ?Constitutional: alert, cooperative and no distress  ?Respiratory: breathing non-labored at rest  ?Cardiovascular: regular rate and sinus rhythm  ?Gastrointestinal: Soft, incisional soreness, non-distended, no rebound/guarding. ?Integumentary: Laparoscopic incisions are CDI with staples and dressing. Previous colostomy with Prevena in place; good seal  ? ?Labs:  ? ?  Latest Ref Rng & Units 03/21/2022  ?  3:41 AM 03/14/2022  ?  8:20 AM 09/14/2021  ?  4:18 AM  ?CBC  ?WBC 4.0 - 10.5 K/uL 14.7   5.3   10.9    ?Hemoglobin 12.0 - 15.0 g/dL 13.5   14.1   11.7    ?Hematocrit 36.0 - 46.0 % 40.8   44.2   33.2    ?Platelets 150 - 400 K/uL 144   224   278    ? ? ?  Latest Ref Rng & Units 03/21/2022  ?  3:41 AM 03/14/2022  ?  8:20 AM 09/14/2021  ?  4:18 AM  ?CMP  ?Glucose 70 - 99 mg/dL 127   89   98    ?BUN 8 - 23 mg/dL '12   20   10    '$ ?Creatinine 0.44 - 1.00 mg/dL 1.16   1.16   0.89     ?Sodium 135 - 145 mmol/L 138   141   136    ?Potassium 3.5 - 5.1 mmol/L 4.5   4.1   3.3    ?Chloride 98 - 111 mmol/L 111   106   103    ?CO2 22 - 32 mmol/L '22   27   24    '$ ?Calcium 8.9 - 10.3 mg/dL 8.4   9.2   8.0    ?Total Protein 6.5 - 8.1 g/dL  7.3     ?Total Bilirubin 0.3 - 1.2 mg/dL  0.8     ?Alkaline Phos 38 - 126 U/L  57     ?AST 15 - 41 U/L  21     ?ALT 0 - 44 U/L  16     ? ? ?Imaging studies: No new pertinent imaging studies ? ? ?Assessment/Plan:  ?71 y.o. female awaiting ROBF 1 Day Post-Op s/p colostomy takedown ? ? - Okay to initiate CLD; go slow; ADAT once bowel function returns ? - Continue Entereg until ROBF ?- Complete perioperative Abx ?- Discontinue foley catheter  ? - Monitor abdominal examination; on-going bowel function ?- Continue Prevena (7  days)  ? - Pain control prn; antiemetics prn  ?- Morning labs ?- Out of bed  ? ?All of the above findings and recommendations were discussed with the patient, and the medical team, and all of patient's questions were answered to her expressed satisfaction. ? ?-- ?Edison Simon, PA-C ?[facility] ?03/21/2022, 8:25 AM ?M-F: 7am - 4pm ? ?

## 2022-03-21 NOTE — Anesthesia Postprocedure Evaluation (Signed)
Anesthesia Post Note ? ?Patient: Abigail Mitchell ? ?Procedure(s) Performed: XI ROBOTIC ASSISTED COLOSTOMY TAKEDOWN (Abdomen) ?APPLICATION OF WOUND VAC ?CYSTOSCOPY WITH ICG (Bilateral) ? ?Patient location during evaluation: PACU ?Anesthesia Type: General ?Level of consciousness: awake and alert, oriented and patient cooperative ?Pain management: pain level controlled ?Vital Signs Assessment: post-procedure vital signs reviewed and stable ?Respiratory status: spontaneous breathing, nonlabored ventilation and respiratory function stable ?Cardiovascular status: blood pressure returned to baseline and stable ?Postop Assessment: adequate PO intake ?Anesthetic complications: no ? ? ?No notable events documented. ? ? ?Last Vitals:  ?Vitals:  ? 03/20/22 2208 03/20/22 2313  ?BP: (!) 134/57 (!) 118/58  ?Pulse: 69 67  ?Resp: 20 18  ?Temp:  36.4 ?C  ?SpO2: 100% 100%  ?  ?Last Pain:  ?Vitals:  ? 03/21/22 0000  ?TempSrc:   ?PainSc: 0-No pain  ? ? ?  ?  ?  ?  ?  ?  ? ?Darrin Nipper ? ? ? ? ?

## 2022-03-21 NOTE — TOC Initial Note (Signed)
Transition of Care (TOC) - Initial/Assessment Note  ? ? ?Patient Details  ?Name: Abigail Mitchell ?MRN: [id number] ?Date of Birth: 1951/05/30 ? ?Transition of Care (TOC) CM/SW Contact:    ?Beverly Sessions, RN ?Phone Number: ?03/21/2022, 9:51 AM ? ?Clinical Narrative:                 ? ? ?  ?Transition of Care (TOC) Screening Note ? ? ?Patient Details  ?Name: Abigail Mitchell ?Date of Birth: Feb 25, 1951 ? ? ?Transition of Care (TOC) CM/SW Contact:    ?Beverly Sessions, RN ?Phone Number: ?03/21/2022, 9:51 AM ? ? ? ?Transition of Care Department [facility]) has reviewed patient and no TOC needs have been identified at this time. We will continue to monitor patient advancement through interdisciplinary progression rounds. If new patient transition needs arise, please place a TOC consult. ? ? ?  ? ? ?Patient Goals and CMS Choice ?  ?  ?  ? ?Expected Discharge Plan and Services ?  ?  ?  ?  ?  ?                ?  ?  ?  ?  ?  ?  ?  ?  ?  ?  ? ?Prior Living Arrangements/Services ?  ?  ?  ?       ?  ?  ?  ?  ? ?Activities of Daily Living ?Home Assistive Devices/Equipment: None ?ADL Screening (condition at time of admission) ?Patient's cognitive ability adequate to safely complete daily activities?: Yes ?Is the patient deaf or have difficulty hearing?: No ?Does the patient have difficulty seeing, even when wearing glasses/contacts?: No ?Does the patient have difficulty concentrating, remembering, or making decisions?: No ?Patient able to express need for assistance with ADLs?: Yes ?Does the patient have difficulty dressing or bathing?: No ?Independently performs ADLs?: Yes (appropriate for developmental age) ?Does the patient have difficulty walking or climbing stairs?: No ?Weakness of Legs: None ?Weakness of Arms/Hands: None ? ?Permission Sought/Granted ?  ?  ?   ?   ?   ?   ? ?Emotional Assessment ?  ?  ?  ?  ?  ?  ? ?Admission diagnosis:  Status post colostomy takedown [Z98.890] ?Patient Active Problem List  ? Diagnosis Date Noted  ? Status  post colostomy takedown 03/20/2022  ? Polyp of descending colon   ? Screen for colon cancer 03/02/2022  ? Hardening of the aorta (main artery of the heart) ([place]) 09/29/2021  ? Hypertrophic condition of skin 09/29/2021  ? Status post colostomy ([place]) 09/29/2021  ? Pain in left leg 12/15/2019  ? Thrombophlebitis of superficial veins of lower extremity 12/15/2019  ? Hyperlipidemia 05/14/2019  ? Chronic kidney disease, stage 3a ([place]) 12/29/2016  ? Hypothyroidism 12/29/2016  ? Phlebitis and thrombophlebitis 12/29/2016  ? Nutritional anemia 10/04/2016  ? Diverticular disease of colon 10/03/2016  ? Right lower quadrant pain 05/17/2016  ? Dysuria 05/15/2016  ? Acute pharyngitis 02/15/2016  ? Fever 02/15/2016  ? Pain 02/15/2016  ? Streptococcal pharyngitis 02/15/2016  ? Acquired absence of ovaries, 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  ? Disorder of bone 12/21/2015  ? Encounter for general adult medical examination without abnormal findings 12/02/2015  ? Trigeminal neuralgia 09/07/2015  ? Benign paroxysmal positional vertigo 05/24/2015  ? Obesity 07/31/2014  ? Herpes zoster without complication 63/89/3734  ? Abnormal results of thyroid function studies 07/20/2011  ? Vitamin D deficiency 09/15/2010  ? Sleep apnea 09/14/2010  ? Abnormal uterine bleeding 07/26/2010  ?  Benign neoplasm of colon 07/26/2010  ? Snoring 07/26/2010  ? Chronic migraine without aura, not intractable, without status migrainosus 07/18/2010  ? Hx of colonic polyps - diminutive adenomas 10/12/2006  ? ?PCP:  Crist Infante, MD ?Pharmacy:   ?[id number]  - [address] [id number]?Phone: [phone]: [phone]? ? ? ? ?Social Determinants of Health (SDOH) Interventions ?  ? ?Readmission Risk Interventions ?   ? View : No data to display.  ?  ?  ?  ? ? ? ?

## 2022-03-22 LAB — BASIC METABOLIC PANEL
Anion gap: 4 — ABNORMAL LOW (ref 5–15)
BUN: 11 mg/dL (ref 8–23)
CO2: 25 mmol/L (ref 22–32)
Calcium: 8.1 mg/dL — ABNORMAL LOW (ref 8.9–10.3)
Chloride: 112 mmol/L — ABNORMAL HIGH (ref 98–111)
Creatinine, Ser: 1.1 mg/dL — ABNORMAL HIGH (ref 0.44–1.00)
GFR, Estimated: 54 mL/min — ABNORMAL LOW (ref >60)
Glucose, Bld: 105 mg/dL — ABNORMAL HIGH (ref 70–99)
Potassium: 4 mmol/L (ref 3.5–5.1)
Sodium: 141 mmol/L (ref 135–145)

## 2022-03-22 LAB — CBC
HCT: 37.5 % (ref 36.0–46.0)
Hemoglobin: 12.1 g/dL (ref 12.0–15.0)
MCH: 31.8 pg (ref 26.0–34.0)
MCHC: 32.3 g/dL (ref 30.0–36.0)
MCV: 98.7 fL (ref 80.0–100.0)
Platelets: 144 10*3/uL — ABNORMAL LOW (ref 150–400)
RBC: 3.8 MIL/uL — ABNORMAL LOW (ref 3.87–5.11)
RDW: 13.2 % (ref 11.5–15.5)
WBC: 12.9 10*3/uL — ABNORMAL HIGH (ref 4.0–10.5)
nRBC: 0 % (ref 0.0–0.2)

## 2022-03-22 LAB — SURGICAL PATHOLOGY

## 2022-03-22 MED ORDER — ACETAMINOPHEN 325 MG PO TABS
650.0000 mg | ORAL_TABLET | Freq: Four times a day (QID) | ORAL | Status: DC | PRN
  Administered 2022-03-22: 650 mg via ORAL
  Filled 2022-03-22: qty 2

## 2022-03-22 MED ORDER — IBUPROFEN 600 MG PO TABS: 600.0000 mg | ORAL_TABLET | Freq: Four times a day (QID) | ORAL | 0 refills | Status: DC | PRN

## 2022-03-22 MED ORDER — OXYCODONE HCL 5 MG PO TABS
5.0000 mg | ORAL_TABLET | Freq: Four times a day (QID) | ORAL | 0 refills | Status: DC | PRN
Start: 2022-03-22 — End: 2022-04-04

## 2022-03-22 NOTE — Progress Notes (Signed)
PIV removed. AVS reviewed. Patient verbalizes understanding of necessary follow up appointments.  ?

## 2022-03-22 NOTE — Discharge Instructions (Signed)
In addition to included general post-operative instructions, ? ?Diet: Resume home diet.  ? ?Activity: No heavy lifting >20 pounds (children, pets, laundry, garbage) for 4 weeks, but light activity and walking are encouraged. Do not drive or drink alcohol if taking narcotic pain medications or having pain that might distract from driving. ? ?Wound care: You may remove other dressings, you have staples underneath. You may shower/get incision wet with soapy water and pat dry (do not rub incisions), but no baths or submerging incision underwater until follow-up.  ? ?Medications: Resume all home medications. For mild to moderate pain: acetaminophen (Tylenol) or ibuprofen/naproxen (if no kidney disease). Combining Tylenol with alcohol can substantially increase your risk of causing liver disease. Narcotic pain medications, if prescribed, can be used for severe pain, though may cause nausea, constipation, and drowsiness. Do not combine Tylenol and Percocet (or similar) within a 6 hour period as Percocet (and similar) contain(s) Tylenol. If you do not need the narcotic pain medication, you do not need to fill the prescription. ? ?Call office [phone] / [phone]) at any time if any questions, worsening pain, fevers/chills, bleeding, drainage from incision site, or other concerns.  ?

## 2022-03-22 NOTE — Progress Notes (Addendum)
[facility] ?SURGICAL PROGRESS NOTE ? ?Hospital Day(s): 2.  ? ?Post op day(s): 2 Days Post-Op.  ? ?Interval History:  ?Patient seen and examined ?No acute events or new complaints overnight.  ?Patient reports she is feeling better; expected soreness ?No fever, chills, nausea, emesis ?Leukocytosis is improved; 12.9K ?Hgb normal at 12.1 ?Renal function seems to be her baseline; sCr - 1.10; UO - unmeasured ?No electrolyte derangements  ?She is on CLD ?Had BM recorded  ? ?Vital signs in last 24 hours: [min-max] current  ?Temp:  [97.8 ?F (36.6 ?C)-99.4 ?F (37.4 ?C)] 97.8 ?F (36.6 ?C) (04/26 2706) ?Pulse Rate:  [65-75] 65 (04/26 0726) ?Resp:  [16-18] 16 (04/26 0726) ?BP: (95-135)/(49-77) 135/63 (04/26 0726) ?SpO2:  [96 %-100 %] 96 % (04/26 0726)     Height: 5' 6.5" (168.9 cm) Weight: 95.3 kg BMI (Calculated): 33.41  ? ?Intake/Output last 2 shifts:  ?04/25 0701 - 04/26 0700 ?In: 2331.1 [P.O.:770; I.V.:1561.1] ?Out: 300 [Urine:300]  ? ?Physical Exam:  ?Constitutional: alert, cooperative and no distress  ?Respiratory: breathing non-labored at rest  ?Cardiovascular: regular rate and sinus rhythm  ?Gastrointestinal: Soft, incisional soreness, non-distended, no rebound/guarding. ?Integumentary: Laparoscopic incisions are CDI with staples and dressing. Previous colostomy with Prevena in place; good seal  ? ?Labs:  ? ?  Latest Ref Rng & Units 03/22/2022  ?  3:55 AM 03/21/2022  ?  3:41 AM 03/14/2022  ?  8:20 AM  ?CBC  ?WBC 4.0 - 10.5 K/uL 12.9   14.7   5.3    ?Hemoglobin 12.0 - 15.0 g/dL 12.1   13.5   14.1    ?Hematocrit 36.0 - 46.0 % 37.5   40.8   44.2    ?Platelets 150 - 400 K/uL 144   144   224    ? ? ?  Latest Ref Rng & Units 03/22/2022  ?  3:55 AM 03/21/2022  ?  3:41 AM 03/14/2022  ?  8:20 AM  ?CMP  ?Glucose 70 - 99 mg/dL 105   127   89    ?BUN 8 - 23 mg/dL '11   12   20    '$ ?Creatinine 0.44 - 1.00 mg/dL 1.10   1.16   1.16    ?Sodium 135 - 145 mmol/L 141   138   141    ?Potassium 3.5 - 5.1 mmol/L 4.0   4.5   4.1     ?Chloride 98 - 111 mmol/L 112   111   106    ?CO2 22 - 32 mmol/L '25   22   27    '$ ?Calcium 8.9 - 10.3 mg/dL 8.1   8.4   9.2    ?Total Protein 6.5 - 8.1 g/dL   7.3    ?Total Bilirubin 0.3 - 1.2 mg/dL   0.8    ?Alkaline Phos 38 - 126 U/L   57    ?AST 15 - 41 U/L   21    ?ALT 0 - 44 U/L   16    ? ? ?Imaging studies: No new pertinent imaging studies ? ? ?Assessment/Plan:  ?71 y.o. female with ROBF 2 Days Post-Op s/p colostomy takedown ? ? - full liquid diet; Soft diet for dinner if doing well ? - Discontinue Entereg  ? - Monitor abdominal examination; on-going bowel function ?- Continue Prevena (7 days)  ? - Pain control prn; antiemetics prn ?- Out of bed  ? ?- Discharge Planning; Hopefully home in AM, will need follow up early next  week for Prevena removal  ? ?All of the above findings and recommendations were discussed with the patient, and the medical team, and all of patient's questions were answered to her expressed satisfaction. ? ?-- ?Edison Simon, PA-C ?[facility] ?03/22/2022, 7:50 AM ?M-F: 7am - 4pm ? ?

## 2022-03-22 NOTE — Progress Notes (Signed)
Mobility Specialist - Progress Note ? ? ? 03/22/22 1500  ?Mobility  ?Activity Ambulated independently in hallway;Stood at bedside;Dangled on edge of bed  ?Level of Assistance Standby assist, set-up cues, supervision of patient - no hands on  ?Assistive Device None  ?Distance Ambulated (ft) 190 ft  ?Activity Response Tolerated well  ?$Mobility charge 1 Mobility  ? ? ?Pre-mobility: 97 HR, 97% SpO2 ?During mobility: 93 HR, 92% SpO2 ? ?Pt supine upon arrival using RA with family at bedside. Pt completes bed mobility and STS indep. Pt ambulates 142f with SUPERVISION and returns to bed with needs in reach. ? ?MMileah Mitchell?Mobility Specialist ?03/22/22, 3:47 PM ? ? ? ? ?

## 2022-03-22 NOTE — Discharge Summary (Signed)
[facility] ?SURGICAL DISCHARGE SUMMARY ? ?Patient ID: ?Abigail Mitchell ?MRN: [id number] ?DOB/AGE: [birth year] 71 y.o. ? ?Admit date: 03/20/2022 ?Discharge date: 03/22/2022 ? ?Discharge Diagnoses ?Patient Active Problem List  ? Diagnosis Date Noted  ? Status post colostomy takedown 03/20/2022  ? ? ?Consultants ?None ? ?Procedures ?03/20/2022:  ?Robotic assisted laparoscopic colostomy takedown ? ?HPI: Abigail Mitchell is a 71 y.o. 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 ?Hospital Course: Informed consent was obtained and documented, and patient underwent uneventful robotic assisted laparoscopic colostomy takedown (Dr Christian Mate, 03/20/2022).  Post-operatively, patient did very well. Advancement of patient's diet and ambulation were well-tolerated. The remainder of patient's hospital course was essentially unremarkable, and discharge planning was initiated accordingly with patient safely able to be discharged home with appropriate discharge instructions, pain control, and outpatient follow-up after all of her questions were answered to her expressed satisfaction.  ? ?Discharge Condition: Good ? ? ?Allergies as of 03/22/2022   ? ?   Reactions  ? Cefdinir Rash  ? Rash in mouth  ? ?  ? ?  ?Medication List  ?  ? ?STOP taking these medications   ? ?bisacodyl 5 MG EC tablet ?Commonly known as: DULCOLAX ?  ?metroNIDAZOLE 500 MG tablet ?Commonly known as: FLAGYL ?  ?neomycin 500 MG tablet ?Commonly known as: MYCIFRADIN ?  ?polyethylene glycol powder 17 GM/SCOOP powder ?Commonly known as: MiraLax ?  ? ?  ? ?TAKE these medications   ? ?acetaminophen 500 MG tablet ?Commonly known as: TYLENOL ?Take 500 mg by mouth every 6 (six) hours as needed for moderate pain. ?  ?B-12 1000 MCG Tabs ?Take 1,000 mcg by mouth as needed. ?  ?fluticasone 50 MCG/ACT nasal spray ?Commonly known as: FLONASE ?Place 1 spray into both nostrils at bedtime as needed. ?  ?ibandronate 150 MG tablet ?Commonly  known as: BONIVA ?Take 150 mg by mouth every 30 (thirty) days. ?  ?ibuprofen 600 MG tablet ?Commonly known as: ADVIL ?Take 1 tablet (600 mg total) by mouth every 6 (six) hours as needed. ?  ?ketotifen 0.025 % ophthalmic solution ?Commonly known as: ZADITOR ?Place 1 drop into both eyes at bedtime as needed (allergies). ?  ?levothyroxine 50 MCG tablet ?Commonly known as: SYNTHROID ?Take 50 mcg by mouth daily. ?  ?montelukast 10 MG tablet ?Commonly known as: SINGULAIR ?Take 10 mg by mouth at bedtime. ?  ?oxyCODONE 5 MG immediate release tablet ?Commonly known as: Oxy IR/ROXICODONE ?Take 1 tablet (5 mg total) by mouth every 6 (six) hours as needed for severe pain or breakthrough pain. ?  ?rosuvastatin 10 MG tablet ?Commonly known as: CRESTOR ?Take 10 mg by mouth daily. ?  ?Vitamin D (Ergocalciferol) 1.25 MG (50000 UNIT) Caps capsule ?Commonly known as: DRISDOL ?Take 50,000 Units by mouth 3 (three) times a week. ?  ? ?  ? ? ? ? Follow-up Information   ? ? Tylene Fantasia, PA-C. Schedule an appointment as soon as possible for a visit on 03/27/2022.   ?Specialty: Physician Assistant ?Why: Follow up on Monday 05/01 with Zach for Prevena removal....OKAY TO BOOK W/ HIM MONDAY AFTER 1030 AM ?Contact information: ?[address] ?[address] ?[place] [address] ?[phone] ? ? ?  ?  ? ?  ?  ? ?  ? ? ? ?Time spent on discharge management including discussion of hospital course, clinical condition, outpatient instructions, prescriptions, and follow up with the patient and members of the medical team: >30 minutes ? ?-- ?  Edison Simon , PA-C ?[facility]  ?03/22/2022, 3:42 PM ?[phone] ?M-F: 7am - 4pm ? ?

## 2022-03-26 DIAGNOSIS — E785 Hyperlipidemia, unspecified: Secondary | ICD-10-CM | POA: Diagnosis not present

## 2022-03-26 DIAGNOSIS — E039 Hypothyroidism, unspecified: Secondary | ICD-10-CM | POA: Diagnosis not present

## 2022-03-26 DIAGNOSIS — M8580 Other specified disorders of bone density and structure, unspecified site: Secondary | ICD-10-CM | POA: Diagnosis not present

## 2022-03-28 ENCOUNTER — Encounter: Payer: Self-pay | Admitting: Physician Assistant

## 2022-03-28 ENCOUNTER — Ambulatory Visit (INDEPENDENT_AMBULATORY_CARE_PROVIDER_SITE_OTHER): Payer: PPO | Admitting: Physician Assistant

## 2022-03-28 VITALS — BP 101/52 | HR 76 | Temp 98.3°F | Ht 66.5 in | Wt 208.4 lb

## 2022-03-28 DIAGNOSIS — Z09 Encounter for follow-up examination after completed treatment for conditions other than malignant neoplasm: Secondary | ICD-10-CM

## 2022-03-28 DIAGNOSIS — Z933 Colostomy status: Secondary | ICD-10-CM

## 2022-03-28 NOTE — Patient Instructions (Signed)
Please keep a dry dressing over the area. You may remove the dressing to shower. Please see your follow up appointment listed below. ? ?

## 2022-03-28 NOTE — Progress Notes (Signed)
[facility] ?POST-OP OFFICE VISIT ? ?03/28/2022 ? ?HPI: ?Abigail Mitchell is a 71 y.o. female 8 days s/p robotic assisted laparoscopic colostomy takedown with lysis of adhesions with Dr Christian Mate ? ?She is overall doing well  ?She has expected post-operative soreness; using ibuprofen and oxycodone sparingly ?Bowel movements continue to be loose ?Some decreased appetite ?No fever, chills, nausea, emesis ?[place] working without issue ?No other complaints ? ?Vital signs: ?BP (!) 101/52   Pulse 76   Temp 98.3 ?F (36.8 ?C) (Oral)   Ht 5' 6.5" (1.689 m)   Wt 208 lb 6.4 oz (94.5 kg)   SpO2 97%   BMI 33.13 kg/m?   ? ?Physical Exam: ?Constitutional: Well appearing female, NAD ?Abdomen: Soft, non-tender, non-distended, no rebound/guarding ?Skin: Laparoscopic incisions are healing well, no erythema or drainage. Staples removed and replaced with steri-strips. Prevena discontinued from colostomy site; wound is CDI with staples. There are two small opens were wicks were removed. There is some serous drainage. I elected to leave staples in place for now.   ? ?Assessment/Plan: ?This is a 71 y.o. female 8 days s/p robotic assisted laparoscopic colostomy takedown with lysis of adhesions ? ? - Prevena discontinued  ? - Pain control prn; continue current regimen ? - Cover colostomy site with dry gauze and tape; change as needed ? - Reviewed lifting restrictions; 6 weeks total ? - She will follow up in 1 week  ? ?-- ?Edison Simon, PA-C ?[facility] ?03/28/2022, 2:27 PM ?M-F: 7am - 4pm ? ?

## 2022-04-04 ENCOUNTER — Encounter: Payer: Self-pay | Admitting: Physician Assistant

## 2022-04-04 ENCOUNTER — Ambulatory Visit (INDEPENDENT_AMBULATORY_CARE_PROVIDER_SITE_OTHER): Payer: PPO | Admitting: Physician Assistant

## 2022-04-04 VITALS — BP 147/71 | HR 77 | Temp 98.6°F | Wt 205.4 lb

## 2022-04-04 DIAGNOSIS — Z09 Encounter for follow-up examination after completed treatment for conditions other than malignant neoplasm: Secondary | ICD-10-CM

## 2022-04-04 DIAGNOSIS — Z933 Colostomy status: Secondary | ICD-10-CM

## 2022-04-04 NOTE — Progress Notes (Signed)
[facility] ?POST-OP OFFICE VISIT ? ?04/04/2022 ? ?HPI: ?Abigail Mitchell is a 71 y.o. female 15 days s/p robotic assisted laparoscopic colostomy takedown with lysis of adhesions with Dr Christian Mate ? ?She continues to do well all things consider ?Abdominal pain is improved  ?Main issue is still lack of appetite and fatigue ?No fever, chills, nausea, emesis, or bowel changes ?No issues with incisions; steri-strips are coming off, colostomy incision is without drainage ?No other complaints ? ?Vital signs: ?BP (!) 147/71   Pulse 77   Temp 98.6 ?F (37 ?C) (Oral)   Wt 205 lb 6.4 oz (93.2 kg)   SpO2 97%   BMI 32.66 kg/m?   ? ?Physical Exam: ?Constitutional: Well appearing female, NAD ?Abdomen: Soft, non-tender, non-distended, no rebound/guarding ?Skin: Laparoscopic incisions are well healed. Previous colostomy incision is well healed, staples removed ? ?Assessment/Plan: ?This is a 71 y.o. female 15 days s/p robotic assisted laparoscopic colostomy takedown with lysis of adhesions ? ? - Last staples removed; steri-strips applied ? - Pain control prn ? - Reviewed wound care recommendation ? - Reviewed lifting restrictions; 6 weeks total ? - She will follow up on 05/18 ? ?-- ?Edison Simon, PA-C ?[facility] ?04/04/2022, 2:22 PM ?M-F: 7am - 4pm ? ?

## 2022-04-04 NOTE — Patient Instructions (Signed)
Follow up on 05/23 at 145 PM ?

## 2022-04-11 ENCOUNTER — Other Ambulatory Visit: Payer: Self-pay | Admitting: Physician Assistant

## 2022-04-11 MED ORDER — FLUCONAZOLE 150 MG PO TABS: 150.0000 mg | ORAL_TABLET | Freq: Every day | ORAL | 0 refills | Status: AC

## 2022-04-13 ENCOUNTER — Ambulatory Visit (INDEPENDENT_AMBULATORY_CARE_PROVIDER_SITE_OTHER): Payer: PPO | Admitting: Physician Assistant

## 2022-04-13 ENCOUNTER — Encounter: Payer: Self-pay | Admitting: Physician Assistant

## 2022-04-13 VITALS — BP 133/83 | HR 72 | Temp 97.4°F | Wt 202.2 lb

## 2022-04-13 DIAGNOSIS — Z933 Colostomy status: Secondary | ICD-10-CM

## 2022-04-13 DIAGNOSIS — Z09 Encounter for follow-up examination after completed treatment for conditions other than malignant neoplasm: Secondary | ICD-10-CM

## 2022-04-13 NOTE — Patient Instructions (Signed)
If you have any concerns or questions, please feel free to call our office.   Colostomy Reversal Surgery, Care After The following information offers guidance on how to care for yourself after your procedure. Your health care provider may also give you more specific instructions. If you have problems or questions, contact your health care provider. What can I expect after the procedure? After the procedure, it is common to have: Pain and discomfort in your abdomen, especially near your incision. Decreased appetite. You may have temporary bowel changes, such as: Passing gas (flatulence). An urgent need to have a bowel movement. More frequent bowel movements. Some leakage of stool or the inability to control when you have bowel movements (incontinence). This may cause skin soreness around your rectum due to exposure to stool and wiping of skin. This usually gets better within a couple weeks. Loose stools or diarrhea. Constipation. Follow these instructions at home: Activity  You may have to avoid lifting. Ask your health care provider how much you can safely lift. Return to your normal activities as told by your health care provider. Ask your health care provider what activities are safe for you. Avoid sitting for a long time without moving. Get up to take short walks every 1-2 hours. This is important to improve blood flow and breathing. Ask for help if you feel weak or unsteady. Avoid activities that take a lot of effort, contact sports, and abdominal exercises for 4 weeks or as long as told by your health care provider. Incision care  Follow instructions from your health care provider about how to take care of your incision. Make sure you: Wash your hands with soap and water for at least 20 seconds before and after you change your bandage (dressing). If soap and water are not available, use hand sanitizer. Change your dressing as told by your health care provider. Leave stitches  (sutures), skin glue, or adhesive strips in place. These skin closures may need to stay in place for 2 weeks or longer. If adhesive strip edges start to loosen and curl up, you may trim the loose edges. Do not remove adhesive strips completely unless your health care provider tells you to do that. Keep the incision area clean and dry. Check your incision area every day for signs of infection. Check for: More redness, swelling, or pain. More fluid or blood. Warmth. Pus or a bad smell. To protect the incision, hold a folded blanket or small pillow against your abdomen when coughing, sneezing, or bending. Bathing Do not take baths, swim, or use a hot tub until your health care provider approves. Ask your health care provider if you may take showers. You may only be allowed to take sponge baths. If your health care provider approves bathing and showering, cover the dressing with a watertight covering to protect it from water. Do not let the dressing get wet. Keep the dressing dry until your health care provider says it can be removed. Driving If you were given a sedative during the procedure, it can affect you for several hours. Do not drive or operate machinery until your health care provider says that it is safe. Ask your health care provider if the medicine prescribed to you requires you to avoid driving or using machinery. Follow other driving restrictions as told by your health care provider. Eating and drinking Follow instructions from your health care provider about eating or drinking restrictions. This may include: What to eat and drink. You may be told to  start eating a bland diet. Over time, you may slowly return to a more normal, healthy diet. How much to eat and drink. You should eat small meals often and stop eating when you feel full. Take nutrition supplements as told by your health care provider or dietitian. General instructions Take over-the-counter and prescription medicines only  as told by your health care provider. Take steps to treat diarrhea or constipation as told by your health care provider. Your health care provider may recommend that you: Drink enough fluid to keep your urine pale yellow. Avoid fluids that contain a lot of sugar or caffeine, such as energy drinks, sports drinks, and soda. Eat bland, easy-to-digest foods in small amounts as you are able. These foods include bananas, applesauce, rice, lean meats, toast, and crackers. Take over-the-counter or prescription medicines. Limit foods that are high in fat and processed sugars, such as fried or sweet foods. If you have skin soreness around your rectum, apply a skin barrier ointment or paste. This can help prevent irritation from occurring or getting worse. Do not use any products that contain nicotine or tobacco. These products include cigarettes, chewing tobacco, and vaping devices, such as e-cigarettes. These can delay incision healing after surgery. If you need help quitting, ask your health care provider. Keep all follow-up visits. This is important. Contact a health care provider if: You have any of these signs of infection: More redness, swelling, or pain at the site of your incision. More fluid or blood coming from your incision. Warmth coming from your incision. Pus or a bad smell coming from your incision. A fever. Your incision breaks open. You feel nauseous. You are constipated, or not able to have a bowel movement. Your diarrhea gets worse. You have pain that is not controlled with medicine. Get help right away if: You have abdominal pain that does not go away or becomes severe. You have frequent vomiting and you are not able to eat or drink. You have difficulty breathing. Summary After colostomy reversal surgery, it is common to have abdominal pain, decreased appetite, diarrhea, or constipation. Follow instructions from your health care provider about how to take care of your incision.  Do not let the dressing get wet. Take over-the-counter and prescription medicines only as told by your health care provider. Contact your health care provider if you are constipated, or not able to have a bowel movement. Keep all follow-up visits. This is important. This information is not intended to replace advice given to you by your health care provider. Make sure you discuss any questions you have with your health care provider. Document Revised: 07/08/2021 Document Reviewed: 07/08/2021 Elsevier Patient Education  [place].

## 2022-04-13 NOTE — Progress Notes (Signed)
[facility] POST-OP OFFICE VISIT  04/13/2022  HPI: Abigail Mitchell is a 71 y.o. female 24 days s/p robotic assisted laparoscopic colostomy takedown with lysis of adhesions with Dr Christian Mate  She reports that each day she is making improvements Still with some fatigue and decreased appetite but getting better No fever, chills, nausea, emesis, nor abdominal pain Bowel function is normal No other complaints   Vital signs: BP 133/83   Pulse 72   Temp (!) 97.4 F (36.3 C) (Oral)   Wt 202 lb 3.2 oz (91.7 kg)   SpO2 98%   BMI 32.15 kg/m    Physical Exam: Constitutional: Well appearing female, NAD Abdomen: Soft, non-tender, non-distended, no rebound/guarding Skin: Colostomy and laparoscopic incisions are now well healed  Assessment/Plan: This is a 71 y.o. female 24 days s/p robotic assisted laparoscopic colostomy takedown with lysis of adhesions with Dr Christian Mate   - Pain control prn  - Reviewed lifting restrictions; 6 weeks total  - I will have her rtc in ~3 months with Dr Christian Mate to ensure she is doing well and close the loop. Otherwise, she understands to call in the interim with questions/concerns   -- Edison Simon, PA-C [facility] 04/13/2022, 2:17 PM M-F: 7am - 4pm\

## 2022-04-25 ENCOUNTER — Encounter: Payer: PPO | Admitting: Physician Assistant

## 2022-05-26 DIAGNOSIS — E669 Obesity, unspecified: Secondary | ICD-10-CM | POA: Diagnosis not present

## 2022-05-26 DIAGNOSIS — E785 Hyperlipidemia, unspecified: Secondary | ICD-10-CM | POA: Diagnosis not present

## 2022-06-14 DIAGNOSIS — R928 Other abnormal and inconclusive findings on diagnostic imaging of breast: Secondary | ICD-10-CM | POA: Diagnosis not present

## 2022-07-18 ENCOUNTER — Ambulatory Visit: Payer: PPO | Admitting: Surgery

## 2022-07-18 ENCOUNTER — Encounter: Payer: Self-pay | Admitting: Surgery

## 2022-07-18 VITALS — BP 138/79 | HR 59 | Temp 98.1°F | Ht 66.5 in | Wt 207.0 lb

## 2022-07-18 DIAGNOSIS — Z09 Encounter for follow-up examination after completed treatment for conditions other than malignant neoplasm: Secondary | ICD-10-CM | POA: Diagnosis not present

## 2022-07-18 DIAGNOSIS — Z933 Colostomy status: Secondary | ICD-10-CM

## 2022-07-18 DIAGNOSIS — Z9889 Other specified postprocedural states: Secondary | ICD-10-CM

## 2022-07-18 NOTE — Patient Instructions (Signed)
Please call the office if you have any questions or concerns. 

## 2022-07-18 NOTE — Progress Notes (Signed)
[facility] POST-OP OFFICE VISIT  07/18/2022  HPI: Abigail Mitchell is a 71 y.o. female 4 months s/p colostomy takedown.  Presents with no complaints.  Minimal residual soreness at incisions, denying constipation or diarrhea.  Vital signs: BP 138/79   Pulse (!) 59   Temp 98.1 F (36.7 C)   Ht 5' 6.5" (1.689 m)   Wt 207 lb (93.9 kg)   SpO2 98%   BMI 32.91 kg/m    Physical Exam: Constitutional: She appears well. Abdomen: Soft and nontender. Skin: Midline and robotic incisions all clean dry and intact.  Colostomy site fully healed.  No evidence of any fascial defect or mass associated with incisions or scars.  Assessment/Plan: This is a 71 y.o. female 4 month s/p robotic assisted laparoscopic colostomy takedown.  Seems to be progressing well without issues.  Patient Active Problem List   Diagnosis Date Noted   Status post colostomy takedown 03/20/2022   Polyp of descending colon    Screen for colon cancer 03/02/2022   Hardening of the aorta (main artery of the heart) ([place]) 09/29/2021   Hypertrophic condition of skin 09/29/2021   Status post colostomy ([place]) 09/29/2021   Pain in left leg 12/15/2019   Thrombophlebitis of superficial veins of lower extremity 12/15/2019   Hyperlipidemia 05/14/2019   Chronic kidney disease, stage 3a ([place]) 12/29/2016   Hypothyroidism 12/29/2016   Phlebitis and thrombophlebitis 12/29/2016   Nutritional anemia 10/04/2016   Diverticular disease of colon 10/03/2016   Right lower quadrant pain 05/17/2016   Dysuria 05/15/2016   Acute pharyngitis 02/15/2016   Fever 02/15/2016   Pain 02/15/2016   Streptococcal pharyngitis 02/15/2016   Acquired absence of ovaries, 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   Disorder of bone 12/21/2015   Encounter for general adult medical examination without abnormal findings 12/02/2015   Trigeminal neuralgia 09/07/2015   Benign paroxysmal positional vertigo 05/24/2015   Obesity 07/31/2014   Herpes zoster without  complication 01/75/1025   Abnormal results of thyroid function studies 07/20/2011   Vitamin D deficiency 09/15/2010   Sleep apnea 09/14/2010   Abnormal uterine bleeding 07/26/2010   Benign neoplasm of colon 07/26/2010   Snoring 07/26/2010   Chronic migraine without aura, not intractable, without status migrainosus 07/18/2010   Hx of colonic polyps - diminutive adenomas 10/12/2006    -May follow-up with [place] as needed.   Ronny Bacon M.D., FACS 07/18/2022, 10:22 AM

## 2022-07-27 DIAGNOSIS — M859 Disorder of bone density and structure, unspecified: Secondary | ICD-10-CM | POA: Diagnosis not present

## 2022-07-27 DIAGNOSIS — E538 Deficiency of other specified B group vitamins: Secondary | ICD-10-CM | POA: Diagnosis not present

## 2022-07-27 DIAGNOSIS — E785 Hyperlipidemia, unspecified: Secondary | ICD-10-CM | POA: Diagnosis not present

## 2022-07-27 DIAGNOSIS — E039 Hypothyroidism, unspecified: Secondary | ICD-10-CM | POA: Diagnosis not present

## 2022-07-27 DIAGNOSIS — E559 Vitamin D deficiency, unspecified: Secondary | ICD-10-CM | POA: Diagnosis not present

## 2022-07-27 DIAGNOSIS — R7989 Other specified abnormal findings of blood chemistry: Secondary | ICD-10-CM | POA: Diagnosis not present

## 2022-08-04 DIAGNOSIS — G4739 Other sleep apnea: Secondary | ICD-10-CM | POA: Diagnosis not present

## 2022-08-04 DIAGNOSIS — R82998 Other abnormal findings in urine: Secondary | ICD-10-CM | POA: Diagnosis not present

## 2022-08-04 DIAGNOSIS — Z0000 Encounter for general adult medical examination without abnormal findings: Secondary | ICD-10-CM | POA: Diagnosis not present

## 2022-08-04 DIAGNOSIS — N1831 Chronic kidney disease, stage 3a: Secondary | ICD-10-CM | POA: Diagnosis not present

## 2022-08-04 DIAGNOSIS — E039 Hypothyroidism, unspecified: Secondary | ICD-10-CM | POA: Diagnosis not present

## 2022-08-04 DIAGNOSIS — E785 Hyperlipidemia, unspecified: Secondary | ICD-10-CM | POA: Diagnosis not present

## 2022-08-04 DIAGNOSIS — I700 Atherosclerosis of aorta: Secondary | ICD-10-CM | POA: Diagnosis not present

## 2022-08-04 DIAGNOSIS — Z23 Encounter for immunization: Secondary | ICD-10-CM | POA: Diagnosis not present

## 2022-08-04 DIAGNOSIS — R030 Elevated blood-pressure reading, without diagnosis of hypertension: Secondary | ICD-10-CM | POA: Diagnosis not present

## 2022-08-04 DIAGNOSIS — M8580 Other specified disorders of bone density and structure, unspecified site: Secondary | ICD-10-CM | POA: Diagnosis not present

## 2022-08-04 DIAGNOSIS — E538 Deficiency of other specified B group vitamins: Secondary | ICD-10-CM | POA: Diagnosis not present

## 2022-08-04 DIAGNOSIS — E669 Obesity, unspecified: Secondary | ICD-10-CM | POA: Diagnosis not present

## 2022-08-04 DIAGNOSIS — H8110 Benign paroxysmal vertigo, unspecified ear: Secondary | ICD-10-CM | POA: Diagnosis not present

## 2022-09-07 ENCOUNTER — Institutional Professional Consult (permissible substitution): Payer: PPO | Admitting: Neurology

## 2022-09-27 ENCOUNTER — Institutional Professional Consult (permissible substitution): Payer: PPO | Admitting: Neurology

## 2022-10-24 ENCOUNTER — Encounter: Payer: Self-pay | Admitting: *Deleted

## 2022-10-25 ENCOUNTER — Ambulatory Visit (INDEPENDENT_AMBULATORY_CARE_PROVIDER_SITE_OTHER): Payer: PPO | Admitting: Neurology

## 2022-10-25 ENCOUNTER — Encounter: Payer: Self-pay | Admitting: Neurology

## 2022-10-25 VITALS — BP 142/73 | HR 64 | Ht 66.5 in | Wt 216.0 lb

## 2022-10-25 DIAGNOSIS — G4733 Obstructive sleep apnea (adult) (pediatric): Secondary | ICD-10-CM | POA: Diagnosis not present

## 2022-10-25 DIAGNOSIS — R030 Elevated blood-pressure reading, without diagnosis of hypertension: Secondary | ICD-10-CM

## 2022-10-25 DIAGNOSIS — E669 Obesity, unspecified: Secondary | ICD-10-CM

## 2022-10-25 DIAGNOSIS — E538 Deficiency of other specified B group vitamins: Secondary | ICD-10-CM | POA: Diagnosis not present

## 2022-10-25 DIAGNOSIS — R351 Nocturia: Secondary | ICD-10-CM | POA: Diagnosis not present

## 2022-10-25 NOTE — Patient Instructions (Signed)

## 2022-10-25 NOTE — Progress Notes (Signed)
Subjective:    Patient ID: Abigail Mitchell is a 71 y.o. female.  HPI    Star Age, MD, PhD [facility] [address]O. [place], [place] [address]   Dear Dr. Joylene Draft,  I saw your patient, Abigail Mitchell, upon your kind request in my Sleep clinic today for initial consultation of her sleep disorder, in particular, evaluation of her prior diagnosis of sleep apnea.  The patient is unaccompanied today.  As you know, Ms. Abigail Mitchell is a 71 year old female with an underlying medical history of diverticulosis and diverticulitis, status post colostomy with reversal about 6 months later in April 2023, chronic kidney disease, hyperlipidemia, hypothyroidism, history of migraines, vitamin D deficiency, and mild obesity, who reports a prior diagnosis of mild sleep apnea in or around 2011.  I reviewed your office note from 08/04/2022.  She has had high blood pressure values for Singh in the morning.  She did a home blood pressure monitor a few months back.  She does report snoring but does not have much in the way of sleep related complaints.  Epworth sleepiness score is 1 out of 24, fatigue severity score is 14 out of 63.  She goes to bed around 930 and rise time is around 6:30 AM.  She had a tonsillectomy at age 51.  She drinks caffeine in the form of coffee, 1 cup in the morning and 3 to 4 cups of decaf tea throughout the day.  She has nocturia once per average night.  She is a non-smoker and does not drink alcohol.  Her weight has been more or less stable.  She works part-time in Insurance claims handler, as an Web designer.  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  She tried an over-the-counter mouth appliance in the past but did not tolerate it.  She lives with her husband, they have 2 children.  She would be reluctant to consider CPAP machine but not completely opposed.  She is willing to proceed with sleep testing.  Her Past Medical History  Is Significant For: Past Medical History:  Diagnosis Date   Chronic kidney disease, stage 3a ([place])    Diverticulitis    Diverticulosis    Hepatitis A 1961   Hx of colonic polyps - diminutive adenomas 10/12/2006   Hyperlipidemia    Hypothyroidism    Insomnia    Migraines    Superficial thrombophlebitis 08/2021   after surgery left leg. Treated with 4 weeks of Xarelto.   Vertigo    no episodes in over 2 years   Vitamin D deficiency    Wears contact lenses     Her Past Surgical History Is Significant For: Past Surgical History:  Procedure Laterality Date   ABDOMINAL HYSTERECTOMY     APPLICATION OF WOUND VAC N/A 03/20/2022   Procedure: APPLICATION OF WOUND VAC;  Surgeon: Ronny Bacon, MD;  Location: [facility];  Service: General;  Laterality: N/A;   BREAST SURGERY Left    calcification   CESAREAN SECTION  1984   COLECTOMY WITH COLOSTOMY CREATION/HARTMANN PROCEDURE N/A 09/09/2021   Procedure: COLECTOMY WITH COLOSTOMY CREATION/HARTMANN PROCEDURE;  Surgeon: Ronny Bacon, MD;  Location: [facility];  Service: General;  Laterality: N/A;   COLONOSCOPY     COLONOSCOPY WITH PROPOFOL N/A 03/10/2022   Procedure: COLONOSCOPY WITH PROPOFOL;  Surgeon: [name], MD;  Location: [place];  Service: Endoscopy;  Laterality: N/A;   CYSTOSCOPY Bilateral 03/20/2022   Procedure: CYSTOSCOPY  WITH ICG;  Surgeon: Billey Co, MD;  Location: [facility];  Service: Urology;  Laterality: Bilateral;   TONSILLECTOMY     TUBAL LIGATION     XI ROBOTIC ASSISTED COLOSTOMY TAKEDOWN N/A 03/20/2022   Procedure: XI ROBOTIC ASSISTED COLOSTOMY TAKEDOWN;  Surgeon: Ronny Bacon, MD;  Location: [facility];  Service: General;  Laterality: N/A;    Her Family History Is Significant For: Family History  Problem Relation Age of Onset   Heart attack Mother    Breast cancer Mother    Heart attack Father    Heart disease Father    Other Brother        rectal tumor    Hyperlipidemia Brother     Hyperlipidemia Brother    Brain cancer Maternal Grandfather    Colon cancer Neg Hx    Sleep apnea Neg Hx     Her Social History Is Significant For: Social History   Socioeconomic History   Marital status: Married    Spouse name: Jeneen Rinks   Number of children: 2   Years of education: Not on file   Highest education level: Not on file  Occupational History   Not on file  Tobacco Use   Smoking status: Never    Passive exposure: Never   Smokeless tobacco: Never  Vaping Use   Vaping Use: Never used  Substance and Sexual Activity   Alcohol use: No    Alcohol/week: 0.0 standard drinks of alcohol   Drug use: No   Sexual activity: Not on file  Other Topics Concern   Not on file  Social History Narrative   Lives at home with husband   Left handed   Caffeine: 1 cup/day   Social Determinants of Health   Financial Resource Strain: Not on file  Food Insecurity: Not on file  Transportation Needs: Not on file  Physical Activity: Not on file  Stress: Not on file  Social Connections: Not on file    Her Allergies Are:  Allergies  Allergen Reactions   Cefdinir Rash    Rash in mouth  :   Her Current Medications Are:  Outpatient Encounter Medications as of 10/25/2022  Medication Sig   acetaminophen (TYLENOL) 500 MG tablet Take 500 mg by mouth every 6 (six) hours as needed for moderate pain.   Cyanocobalamin (B-12) 1000 MCG TABS Take 1,000 mcg by mouth as needed.   fluticasone (FLONASE) 50 MCG/ACT nasal spray Place 1 spray into both nostrils at bedtime as needed.   ibandronate (BONIVA) 150 MG tablet Take 150 mg by mouth every 30 (thirty) days.   ketotifen (ZADITOR) 0.025 % ophthalmic solution Place 1 drop into both eyes at bedtime as needed (allergies).   levothyroxine (SYNTHROID) 50 MCG tablet Take 50 mcg by mouth daily.   montelukast (SINGULAIR) 10 MG tablet Take 10 mg by mouth at bedtime.   rosuvastatin (CRESTOR) 10 MG tablet Take 10 mg by mouth daily.   Vitamin D,  Ergocalciferol, (DRISDOL) 1.25 MG (50000 UNIT) CAPS capsule Take 50,000 Units by mouth 3 (three) times a week.   [DISCONTINUED] polyethylene glycol-electrolytes (NULYTELY) 420 g solution Take 4,000 mLs by mouth once. (Patient not taking: Reported on 10/25/2022)   No facility-administered encounter medications on file as of 10/25/2022.  :   Review of Systems:  Out of a complete 14 point review of systems, all are reviewed and negative with the exception of these symptoms as listed below:  Review of Systems  Neurological:  Patient is here alone for sleep consult. She has a history of sleep apnea but states she was told she was fine and she didn't need a CPAP. She endorses snoring. She was told her BP increases in the AM. She states she doesn't have any trouble sleeping. Seldom does she need a nap. ESS 1 FSS 14.    Objective:  Neurological Exam  Physical Exam Physical Examination:   Vitals:   10/25/22 1416  BP: (!) 142/73  Pulse: 64    General Examination: The patient is a very pleasant 71 y.o. female in no acute distress. She appears well-developed and well-nourished and well groomed.   HEENT: Normocephalic, atraumatic, pupils are equal, round and reactive to light, extraocular tracking is good without limitation to gaze excursion or nystagmus noted. Hearing is grossly intact. Face is symmetric with normal facial animation. Speech is clear with no dysarthria noted. There is no hypophonia. There is no lip, neck/head, jaw or voice tremor. Neck is supple with full range of passive and active motion. There are no carotid bruits on auscultation. Oropharynx exam reveals: mild mouth dryness, adequate dental hygiene with bridge of the bottom 4 front teeth, and mild airway crowding, due to small airway entry and redundant soft palate.  Tonsils are absent, neck circumference 15 1/8 inches.  Tongue protrudes centrally and palate elevates symmetrically.  Chest: Clear to auscultation without  wheezing, rhonchi or crackles noted.  Heart: S1+S2+0, regular and normal without murmurs, rubs or gallops noted.   Abdomen: Soft, non-tender and non-distended.  Extremities: There is no pitting edema in the distal lower extremities bilaterally.   Skin: Warm and dry without trophic changes noted.   Musculoskeletal: exam reveals no obvious joint deformities.   Neurologically:  Mental status: The patient is awake, alert and oriented in all 4 spheres. Her immediate and remote memory, attention, language skills and fund of knowledge are appropriate. There is no evidence of aphasia, agnosia, apraxia or anomia. Speech is clear with normal prosody and enunciation. Thought process is linear. Mood is normal and affect is normal.  Cranial nerves II - XII are as described above under HEENT exam.  Motor exam: Normal bulk, strength and tone is noted. There is no obvious action or resting tremor.  Fine motor skills and coordination: grossly intact.  Cerebellar testing: No dysmetria or intention tremor. There is no truncal or gait ataxia.  Sensory exam: intact to light touch in the upper and lower extremities.  Gait, station and balance: She stands easily. No veering to one side is noted. No leaning to one side is noted. Posture is age-appropriate and stance is narrow based. Gait shows normal stride length and normal pace. No problems turning are noted.   Assessment and plan:   In summary, LASHONDA SONNEBORN is a very pleasant 71 y.o.-year old female with an underlying medical history of diverticulosis and diverticulitis, status post colostomy with reversal about 6 months later in April 2023, chronic kidney disease, hyperlipidemia, hypothyroidism, history of migraines, vitamin D deficiency, and mild obesity, who presents for evaluation of her obstructive sleep apnea.  She was diagnosed with mild obstructive sleep apnea over 10 years ago, she has not been on PAP therapy.  She has had some elevated blood pressure  values in the morning, currently not on blood pressure medication.   I had a long chat with the patient about my findings and the diagnosis of sleep apnea, particularly OSA, its prognosis and treatment options. We talked about medical/conservative treatments, surgical interventions  and non-pharmacological approaches for symptom control. I explained, in particular, the risks and ramifications of untreated moderate to severe OSA, especially with respect to developing cardiovascular disease down the road, including congestive heart failure (CHF), difficult to treat hypertension, cardiac arrhythmias (particularly A-fib), neurovascular complications including TIA, stroke and dementia. Even type 2 diabetes has, in part, been linked to untreated OSA. Symptoms of untreated OSA may include (but may not be limited to) daytime sleepiness, nocturia (i.e. frequent nighttime urination), memory problems, mood irritability and suboptimally controlled or worsening mood disorder such as depression and/or anxiety, lack of energy, lack of motivation, physical discomfort, as well as recurrent headaches, especially morning or nocturnal headaches. We talked about the importance of maintaining a healthy lifestyle and striving for healthy weight.  I recommended a sleep study at this time. I outlined the differences between a laboratory attended sleep study which is considered more comprehensive and accurate over the option of a home sleep test (HST); the latter may lead to underestimation of sleep disordered breathing in some instances and does not help with diagnosing upper airway resistance syndrome and is not accurate enough to diagnose primary central sleep apnea typically. I outlined possible surgical and non-surgical treatment options of OSA, including the use of a positive airway pressure (PAP) device (i.e. CPAP, AutoPAP/APAP or BiPAP in certain circumstances), a custom-made dental device (aka oral appliance, which would require a  referral to a specialist dentist or orthodontist typically, and is generally speaking not considered for patients with full dentures or edentulous state), upper airway surgical options, such as traditional UPPP (which is not considered a first-line treatment) or the Inspire device (hypoglossal nerve stimulator, which would involve a referral for consultation with an ENT surgeon, after careful selection, following inclusion criteria - also not first-line treatment). I explained the PAP treatment option to the patient in detail, as this is generally considered first-line treatment.  The patient indicated that she would be willing to try PAP therapy, if the need arises. I explained the importance of being compliant with PAP treatment, not only for insurance purposes but primarily to improve patient's symptoms symptoms, and for the patient's long term health benefit, including to reduce Her cardiovascular risks longer-term.    We will pick up our discussion about the next steps and treatment options after testing.  We will keep her posted as to the test results by phone call and/or MyChart messaging where possible.  We will plan to follow-up in sleep clinic accordingly as well.  I answered all her questions today and the patient was in agreement.   I encouraged her to call with any interim questions, concerns, problems or updates or email [place] through [place].  Generally speaking, sleep test authorizations may take up to 2 weeks, sometimes less, sometimes longer, the patient is encouraged to get in touch with [place] if they do not hear back from the sleep lab staff directly within the next 2 weeks.  Thank you very much for allowing me to participate in the care of this nice patient. If I can be of any further assistance to you please do not hesitate to call me at [phone].  Sincerely,   Star Age, MD, PhD

## 2022-11-02 ENCOUNTER — Telehealth: Payer: Self-pay | Admitting: Neurology

## 2022-11-02 NOTE — Telephone Encounter (Signed)
[facility] Pending faxed notes

## 2022-12-06 NOTE — Telephone Encounter (Signed)
Rec'd auth from [facility] for [id number]. Ref# [id number] 12/7-01/31/2023  LVM for pt to call back to schedule sleep study.

## 2022-12-12 NOTE — Telephone Encounter (Signed)
LVM for pt to call back to schedule sleep study.   We have attempted to call the patient two times to schedule sleep study.  Patient has been unavailable at the phone numbers we have on file and has not returned our calls.  If patient calls back we will schedule them for their sleep study.

## 2023-01-09 NOTE — Telephone Encounter (Signed)
Patient called and r/s for 03/11/23 at 9 pm.  Mailed packet to the patient. Will redo auth once it gets closer to her appt time.

## 2023-01-29 NOTE — Telephone Encounter (Signed)
Faxed notes [facility] pending a new authorization.

## 2023-02-01 NOTE — Telephone Encounter (Signed)
Updated auth info  [employer]- [employer] Josem [id number] (exp. 01/29/23 to 05/01/23)   Patient is scheduledat [facility] for 03/11/23 at 9 pm.

## 2023-02-16 DIAGNOSIS — R0981 Nasal congestion: Secondary | ICD-10-CM | POA: Diagnosis not present

## 2023-02-16 DIAGNOSIS — E538 Deficiency of other specified B group vitamins: Secondary | ICD-10-CM | POA: Diagnosis not present

## 2023-02-16 DIAGNOSIS — Z1152 Encounter for screening for COVID-19: Secondary | ICD-10-CM | POA: Diagnosis not present

## 2023-02-16 DIAGNOSIS — R5383 Other fatigue: Secondary | ICD-10-CM | POA: Diagnosis not present

## 2023-02-16 DIAGNOSIS — J0100 Acute maxillary sinusitis, unspecified: Secondary | ICD-10-CM | POA: Diagnosis not present

## 2023-03-11 ENCOUNTER — Ambulatory Visit (INDEPENDENT_AMBULATORY_CARE_PROVIDER_SITE_OTHER): Payer: PPO | Admitting: Neurology

## 2023-03-11 DIAGNOSIS — G4720 Circadian rhythm sleep disorder, unspecified type: Secondary | ICD-10-CM

## 2023-03-11 DIAGNOSIS — R351 Nocturia: Secondary | ICD-10-CM

## 2023-03-11 DIAGNOSIS — G4733 Obstructive sleep apnea (adult) (pediatric): Secondary | ICD-10-CM | POA: Diagnosis not present

## 2023-03-11 DIAGNOSIS — E669 Obesity, unspecified: Secondary | ICD-10-CM

## 2023-03-11 DIAGNOSIS — R030 Elevated blood-pressure reading, without diagnosis of hypertension: Secondary | ICD-10-CM

## 2023-03-13 NOTE — Addendum Note (Signed)
Addended by: Huston Foley on: 03/13/2023 06:10 PM   Modules accepted: Orders

## 2023-03-13 NOTE — Procedures (Signed)
Physician Interpretation:     [facility] POLYSOMNOGRAPHY  INTERPRETATION REPORT   STUDY DATE:  03/11/2023     PATIENT NAME:  Abigail Mitchell         DATE OF BIRTH:  04-15-1951  PATIENT ID:  [id number]    TYPE OF STUDY:  PSG  READING PHYSICIAN: Huston Foley, MD, PhD   SCORING TECHNICIAN: Margaretann Loveless, RPSGT  Referred by: Rodrigo Ran, MD   History and Indication for Testing: 72 year old female with an underlying medical history of diverticulosis and diverticulitis, status post colostomy with reversal about 6 months later in April 2023, chronic kidney disease, hyperlipidemia, hypothyroidism, history of migraines, vitamin D deficiency, and mild obesity, who reports a prior diagnosis of OSA. Her Epworth sleepiness score is 1 out of 24, fatigue severity score is 14 out of 63.  Height: 67 in Weight: 216 lb (BMI 34) Neck Size: 15 in   MEDICATIONS: Tylenol, Vitamin B 12, Flonase, Boniva, Zaditor, Synthroid, Singulair, Crestor, Vitamin D  TECHNICAL DESCRIPTION: A registered sleep technologist was in attendance for the duration of the recording.  Data collection, scoring, video monitoring, and reporting were performed in compliance with the AASM Manual for the Scoring of Sleep and Associated Events; (Hypopnea is scored based on the criteria listed in Section VIII D. 1b in the AASM Manual V2.6 using a 4% oxygen desaturation rule or Hypopnea is scored based on the criteria listed in Section VIII D. 1a in the AASM Manual V2.6 using 3% oxygen desaturation and /or arousal rule).   SLEEP CONTINUITY AND SLEEP ARCHITECTURE:  Lights-out was at 21:35: and lights-on at  05:04:, with a total recording time of 7 hours, 30 min. Total sleep time ( TST) was 286.0 minutes with a decreased sleep efficiency at 63.6%. There was  16.1% REM sleep.   BODY POSITION:  TST was divided  between the following sleep positions: 51.6% supine;  48.4% lateral;  0% prone. Duration of total sleep and  percent of total sleep in their respective position is as follows: supine 147 minutes (52%), non-supine 139 minutes (48%); right 00 minutes (0%), left 138 minutes (48%), and prone 00 minutes (0%).  Total supine REM sleep time was 46 minutes (100% of total REM sleep).  Sleep latency was increased at 53.5 minutes.  REM sleep latency was increased at 199.0 minutes. Of the total sleep time, the percentage of stage N1 sleep was 12.2%, which is increased, stage N2 sleep was 71%, which is increased, stage N3 sleep was nearly absent at 0.9%, and REM sleep was 16.1%, which is reduced. Wake after sleep onset (WASO) time accounted for 110.5 minutes with mild to moderate sleep fragmentation noted.   RESPIRATORY MONITORING:   Based on CMS criteria (using a 4% oxygen desaturation rule for scoring hypopneas), there were 22 apneas (21 obstructive; 1 central; 0 mixed), and 31 hypopneas.  Apnea index was 4.6. Hypopnea index was 6.5. The apnea-hypopnea index was 11.1/hour overall (20.3 supine, 0 non-supine; 49.6 REM, 49.6 supine REM).  There were 6 respiratory effort-related arousals (RERAs).  The RERA index was 1 events/h. Total respiratory disturbance index (RDI) was 12.4 events/h. RDI results showed: supine RDI  21.6 /h; non-supine RDI 2.6 /h; REM RDI 49.6 /h, supine REM RDI 49.6 /h.   Based on AASM criteria (using a 3% oxygen desaturation and /or arousal rule for scoring hypopneas), there were 22 apneas (21 obstructive; 1 central; 0 mixed), and 31 hypopneas. Apnea index was 4.6. Hypopnea index was 6.5. The  apnea-hypopnea index was 11.1 overall (20.3 supine, 0 non-supine; 49.6 REM, 49.6 supine REM).  There were 6 respiratory effort-related arousals (RERAs).  The RERA index was 1 events/h. Total respiratory disturbance index (RDI) was 12.4 events/h. RDI results showed: supine RDI  21.6 /h; non-supine RDI 2.6 /h; REM RDI 49.6 /h, supine REM RDI 49.6 /h.   OXIMETRY: Oxyhemoglobin Saturation Nadir during sleep was at  79%  from a mean of 96%.  Of the Total sleep time (TST)   hypoxemia (=<88%) was present for  7.4 minutes, or 2.6% of total sleep time.   LIMB MOVEMENTS: There were 0 periodic limb movements of sleep (0.0/hr), of which 0 (0.0/hr) were associated with an arousal.  AROUSAL: There were 139 arousals in total, for an arousal index of 29 arousals/hour.  Of these, 22 were identified as respiratory-related arousals (5 /h), 0 were PLM-related arousals (0 /h), and 133 were non-specific arousals (28 /h).  EEG: Review of the EEG showed no abnormal electrical discharges and symmetrical bihemispheric findings.    EKG: The EKG revealed normal sinus rhythm (NSR). The average heart rate during sleep was 55 bpm.   AUDIO/VIDEO REVIEW: The audio and video review did not show any abnormal or unusual behaviors, movements, phonations or vocalizations. The patient took no restroom breaks. Snoring was in the mild to moderate range.  POST-STUDY QUESTIONNAIRE: Post study, the patient indicated, that sleep was worse than usual.   IMPRESSION:  1. Obstructive Sleep Apnea (OSA) 2. Dysfunctions associated with sleep stages or arousal from sleep  RECOMMENDATIONS:  1. This study demonstrates overall mild obstructive sleep apnea, moderate in supine sleep and severe during REM sleep with a total AHI of 11.1/hour, REM AHI of 49.6/hour, supine AHI of 20.3/hour and O2 nadir of 79% (during supine REM sleep). Given the patient's medical history and sleep related complaints, treatment with positive airway pressure is recommended; this can be achieved in the form of autoPAP. Alternatively, a full-night CPAP titration study would allow optimization of therapy if needed. Other treatment options may include avoidance of supine sleep position along with weight loss, or the use of an oral appliance in selected patients. Please note, that untreated obstructive sleep apnea may carry additional perioperative morbidity. Patients with significant  obstructive sleep apnea should receive perioperative PAP therapy and the surgeons and particularly the anesthesiologist should be informed of the diagnosis and the severity of the sleep disordered breathing. 2. This study shows sleep fragmentation and abnormal sleep stage percentages; these are nonspecific findings and per se do not signify an intrinsic sleep disorder or a cause for the patient's sleep-related symptoms. Causes include (but are not limited to) the first night effect of the sleep study, circadian rhythm disturbances, medication effect or an underlying mood disorder or medical problem.  3. The patient should be cautioned not to drive, work at heights, or operate dangerous or heavy equipment when tired or sleepy. Review and reiteration of good sleep hygiene measures should be pursued with any patient. 4. The patient will be seen in follow-up by Dr. Frances Furbish at [facility] for discussion of the test results and further management strategies. The referring provider will be notified of the test results.   I certify that I have reviewed the entire raw data recording prior to the issuance of this report in accordance with the Standards of Accreditation of the American Academy of Sleep Medicine (AASM).  Huston Foley, MD, PhD Medical Director, [facility]) Diplomat, ABPN (Neurology and Sleep)  Technical Report:   General Information  Name: Lundon, Rosier BMI: 03.47 Physician: Huston Foley, MD  ID: [id number] Height: 66.5 in Technician: Margaretann Loveless, RPSGT  Sex: Female Weight: 216.0 lb Record: [id number]  Age: 67 [[birth year]] Date: 03/11/2023    Medical & Medication History    Ms. Petruska is a 72 year old female with an underlying medical history of diverticulosis and diverticulitis, status post colostomy with reversal about 6 months later in April 2023, chronic kidney disease, hyperlipidemia, hypothyroidism, history of migraines, vitamin D  deficiency, and mild obesity, who reports a prior diagnosis of mild sleep apnea in or around 2011. She has had high blood pressure values for Singh in the morning. She did a home blood pressure monitor a few months back. She does report snoring but does not have much in the way of sleep related complaints. Tylenol, Vitamin B 12, Flonase, Boniva, Zaditor, Synthroid, Singulair, Crestor, Vitamin D   Sleep Disorder      Comments   The patient came into the sleep lab for a PSG. No restroom trips. EKG kept in NSR. Mild to moderate snoring. Some leg movements. All respiratory events scored with a 4% desat. The patient slept supine and lateral. The patient did have some RERA's. AHI was 1.5 after 2 hrs TST. Majority of respiratory events were in REM.     Lights out: 09:35:00 PM Lights on: 05:04:50 AM   Time Total Supine Side Prone Upright  Recording (TRT) 7h 30.55m 4h 17.73m 3h 13.9m 0h 0.39m 0h 0.50m  Sleep (TST) 4h 46.10m 2h 27.49m 2h 18.32m 0h 0.65m 0h 0.76m   Latency N1 N2 N3 REM Onset Per. Slp. Eff.  Actual 0h 0.18m 0h 19.67m 2h 23.66m 3h 19.35m 0h 53.44m 1h 58.22m 63.56%   Stg Dur Wake N1 N2 N3 REM  Total 164.0 35.0 202.5 2.5 46.0  Supine 109.5 18.5 83.0 0.0 46.0  Side 54.5 16.5 119.5 2.5 0.0  Prone 0.0 0.0 0.0 0.0 0.0  Upright 0.0 0.0 0.0 0.0 0.0   Stg % Wake N1 N2 N3 REM  Total 36.4 12.2 70.8 0.9 16.1  Supine 24.3 6.5 29.0 0.0 16.1  Side 12.1 5.8 41.8 0.9 0.0  Prone 0.0 0.0 0.0 0.0 0.0  Upright 0.0 0.0 0.0 0.0 0.0     Apnea Summary Sub Supine Side Prone Upright  Total 22 Total 22 22 0 0 0    REM 21 21 0 0 0    NREM 1 1 0 0 0  Obs 21 REM 21 21 0 0 0    NREM 0 0 0 0 0  Mix 0 REM 0 0 0 0 0    NREM 0 0 0 0 0  Cen 1 REM 0 0 0 0 0    NREM 1 1 0 0 0   Rera Summary Sub Supine Side Prone Upright  Total 6 Total 0 0    REM 0 0 0 0 0    NREM 0 0   Hypopnea Summary Sub Supine Side Prone Upright  Total 31 Total 0 0    REM 17 17 0 0 0    NREM 0 0   4% Hypopnea Summary  Sub Supine Side Prone Upright  Total (4%) 31 Total 0 0    REM 17 17 0 0 0    NREM 0 0     AHI Total Obs Mix Cen  11.12  Apnea 4.62 4.41 0.00 0.21   Hypopnea 6.50 -- -- --  11.12 Hypopnea (4%) 6.50 -- -- --    Total Supine Side Prone Upright  Position AHI 11.12 20.34 1.30 0.00 0.00  REM AHI 49.57   NREM AHI 3.75   Position RDI 12.38 21.56 2.60 0.00 0.00  REM RDI 49.57   NREM RDI 5.25    4% Hypopnea Total Supine Side Prone Upright  Position AHI (4%) 11.12 20.34 1.30 0.00 0.00  REM AHI (4%) 49.57   NREM AHI (4%) 3.75   Position RDI (4%) 12.38 21.56 2.60 0.00 0.00  REM RDI (4%) 49.57   NREM RDI (4%) 5.25    Desaturation Information Threshold: 2% <100% <90% <80% <70% <60% <50% <40%  Supine 163.0 28.0 1.0 0.0 0.0 0.0 0.0  Side 83.0 3.0 0.0 0.0 0.0 0.0 0.0  Prone 0.0 0.0 0.0 0.0 0.0 0.0 0.0  Upright 0.0 0.0 0.0 0.0 0.0 0.0 0.0  Total 246.0 31.0 1.0 0.0 0.0 0.0 0.0  Index 37.2 4.7 0.2 0.0 0.0 0.0 0.0   Threshold: 3% <100% <90% <80% <70% <60% <50% <40%  Supine 95.0 28.0 1.0 0.0 0.0 0.0 0.0  Side 17.0 3.0 0.0 0.0 0.0 0.0 0.0  Prone 0.0 0.0 0.0 0.0 0.0 0.0 0.0  Upright 0.0 0.0 0.0 0.0 0.0 0.0 0.0  Total 112.0 31.0 1.0 0.0 0.0 0.0 0.0  Index 16.9 4.7 0.2 0.0 0.0 0.0 0.0   Threshold: 4% <100% <90% <80% <70% <60% <50% <40%  Supine 75.0 28.0 1.0 0.0 0.0 0.0 0.0  Side 9.0 3.0 0.0 0.0 0.0 0.0 0.0  Prone 0.0 0.0 0.0 0.0 0.0 0.0 0.0  Upright 0.0 0.0 0.0 0.0 0.0 0.0 0.0  Total 84.0 31.0 1.0 0.0 0.0 0.0 0.0  Index 12.7 4.7 0.2 0.0 0.0 0.0 0.0   Threshold: 3% <100% <90% <80% <70% <60% <50% <40%  Supine 95 28 1 0 0 0 0  Side 17 3 0 0 0 0 0  Prone 0 0 0 0 0 0 0  Upright 0 0 0 0 0 0 0  Total 112 31 1 0 0 0 0   Awakening/Arousal Information # of Awakenings 40  Wake after sleep onset 110.29m  Wake after persistent sleep 71.77m   Arousal Assoc. Arousals Index  Apneas 10 2.1  Hypopneas 12 2.5  Leg Movements 1 0.2  Snore 0 0.0  PTT Arousals 0 0.0  Spontaneous 133  27.9  Total 162 34.0  Leg Movement Information PLMS LMs Index  Total LMs during PLMS 0 0.0  LMs w/ Microarousals 0 0.0   LM LMs Index  w/ Microarousal 1 0.2  w/ Awakening 0 0.0  w/ Resp Event 0 0.0  Spontaneous 31 6.5  Total 32 6.7     Desaturation threshold setting: 3% Minimum desaturation setting: 10 seconds SaO2 nadir: 79% The longest event was a 49 sec obstructive Hypopnea with a minimum SaO2 of 80%. The lowest SaO2 was 79% associated with a 40 sec obstructive Hypopnea. EKG Rates EKG Avg Max Min  Awake 58 80 48  Asleep 55 77 48  EKG Events: N/A

## 2023-03-15 ENCOUNTER — Telehealth: Payer: Self-pay | Admitting: *Deleted

## 2023-03-15 NOTE — Telephone Encounter (Signed)
Spoke with patient and discussed her sleep study results. Patient is unsure if she wants to pursue treatment for sleep apnea. She is wanting to discuss the results with Dr Waynard Edwards first. I did also offer for her to schedule an appointment with one of our providers to discuss the results in further detail but at this time she prefers to talk to Dr Waynard Edwards. I have sent the results to him with a note. I did also explain that her OSA, although overall mild, is much worse during REM sleep. I reviewed some possible complications of untreated sleep apnea such as HTN other heart abnormalities, memory trouble, headaches, and in severe cases stroke or heart attack. Patient states she was awake a lot during the night and she wasn't sure the results were reflective of a good night's sleep. I did advise that its possible that the results could have underestimated the events since we are seeing worse events during REM sleep. She thanked me for the call.

## 2023-03-15 NOTE — Telephone Encounter (Signed)
-----   Message from Huston Foley, MD sent at 03/13/2023  6:10 PM EDT ----- Patient referred by Dr. Waynard Edwards, seen by me on 10/25/22 for re-eval of her OSA, diagnostic PSG on 03/11/23.    Please call and notify the patient that the recent sleep study did confirm the diagnosis of obstructive sleep apnea. OSA is overall mild, but significantly worse during dream sleep and in the back sleep position, with her lowest O2 drop at 79%. I recommend treatment in the form of autoPAP, which means, that we don't have to bring her back for a second sleep study with CPAP, but will let him try an autoPAP machine at home, through a DME company (of her choice, or as per insurance requirement). The DME representative will educate her on how to use the machine, how to put the mask on, etc. I have placed an order in the chart. Please send referral, talk to patient, send report to referring MD. We will need a FU in sleep clinic for 10 weeks post-PAP set up, please arrange that with me or one of our NPs. Thanks,   Huston Foley, MD, PhD [facility])

## 2023-03-16 NOTE — Telephone Encounter (Signed)
Noted, thank you

## 2023-03-30 IMAGING — RF DG BE SINGLE CONTRAST
13 series · 13 of 13 positions shown · non-contrast
Comparison: Correlation made with prior CT

CLINICAL DATA: Large bowel obstruction

EXAM:
SINGLE CONTRAST BARIUM ENEMA
TECHNIQUE: Initial scout AP supine abdominal image obtained to insure adequate
colon cleansing. Barium was introduced into the colon in a
retrograde fashion and refluxed from the rectum to the sigmoid
colon. Spot images of the colon followed by overhead radiographs
were obtained.
FLUOROSCOPY TIME:  Fluoroscopy Time:  54 seconds
Radiation Exposure Index (if provided by the fluoroscopic device):
36.10 mGy

[Series 1: t abdomen supine · 0.14mm/px · 1 of 1 slices shown (1 of 4)]
[im 1/1]
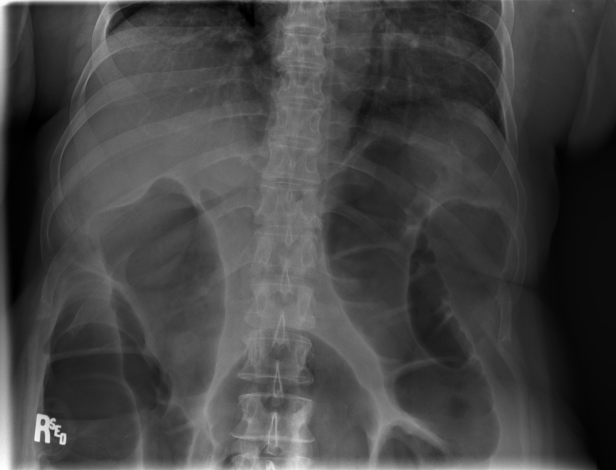

[Series 2: t abdomen supine · 0.14mm/px · 1 of 1 slices shown (2 of 4)]
[im 1/1]
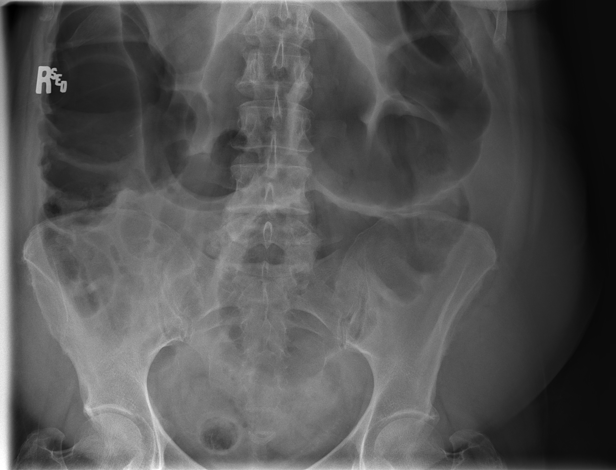

[Series 3: cp_standard · 0.26mm/px · 1 of 1 slices shown (1 of 4)]
[im 1/1]
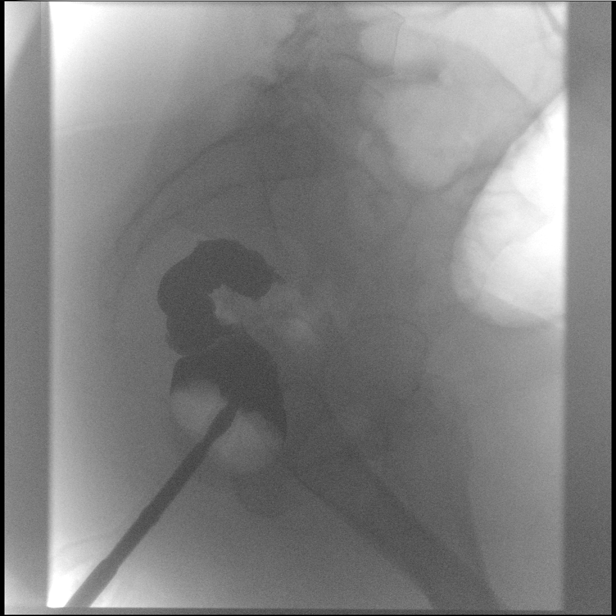

[Series 4: cp_standard · 0.26mm/px · 1 of 1 slices shown (2 of 4)]
[im 1/1]
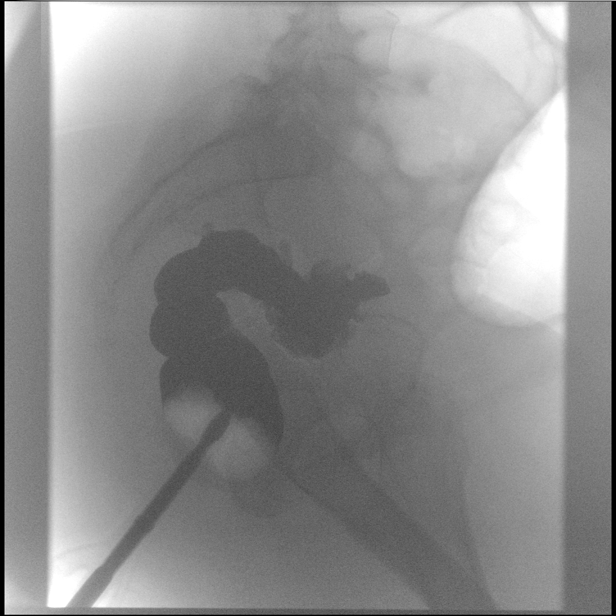

[Series 5: cp_standard · 0.26mm/px · 1 of 1 slices shown (3 of 4)]
[im 1/1]
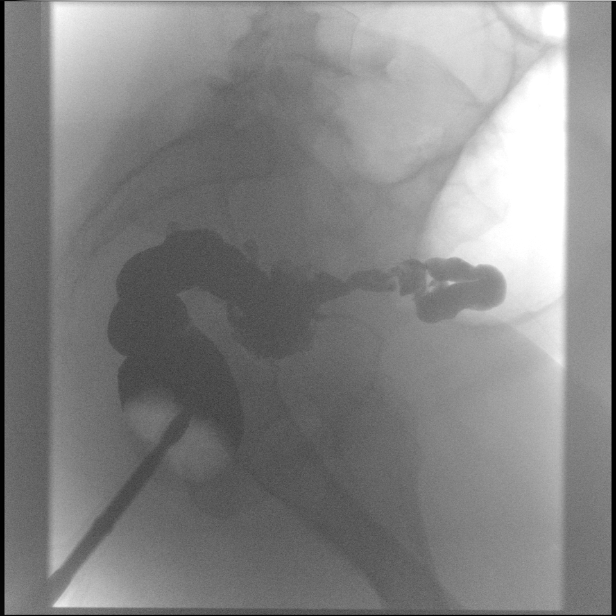

[Series 6: fluoro_barium singleshot_bb · 0.17mm/px · 1 of 1 slices shown (1 of 5)]
[im 1/1]
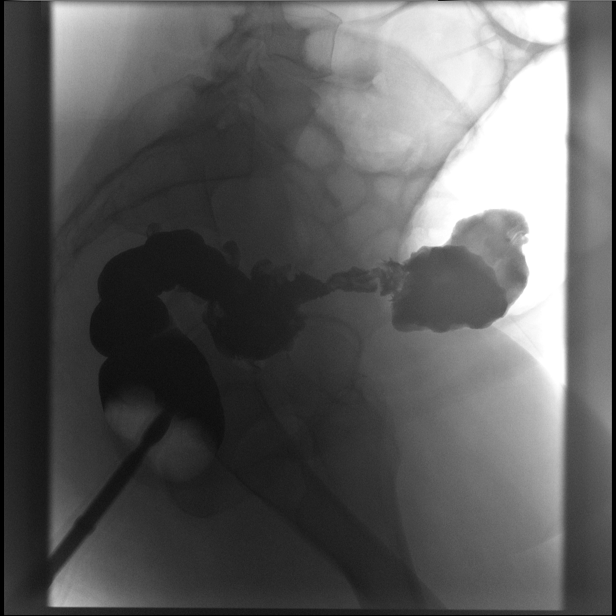

[Series 7: fluoro_barium singleshot_bb · 0.17mm/px · 1 of 1 slices shown (2 of 5)]
[im 1/1]
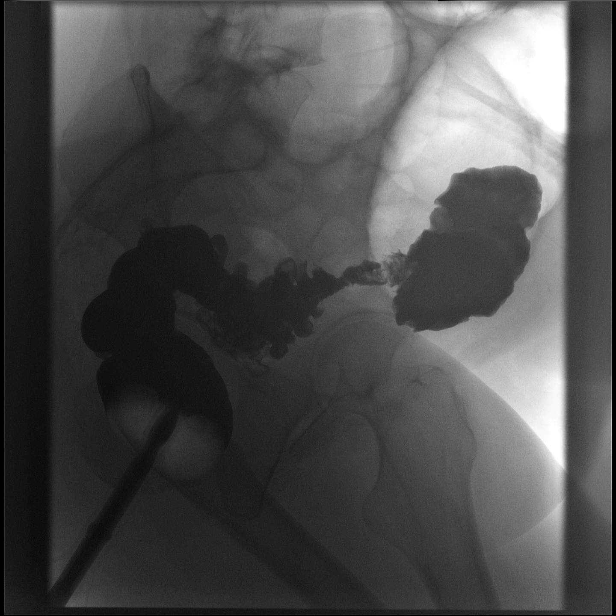

[Series 8: fluoro_barium singleshot_bb · 0.17mm/px · 1 of 1 slices shown (3 of 5)]
[im 1/1]
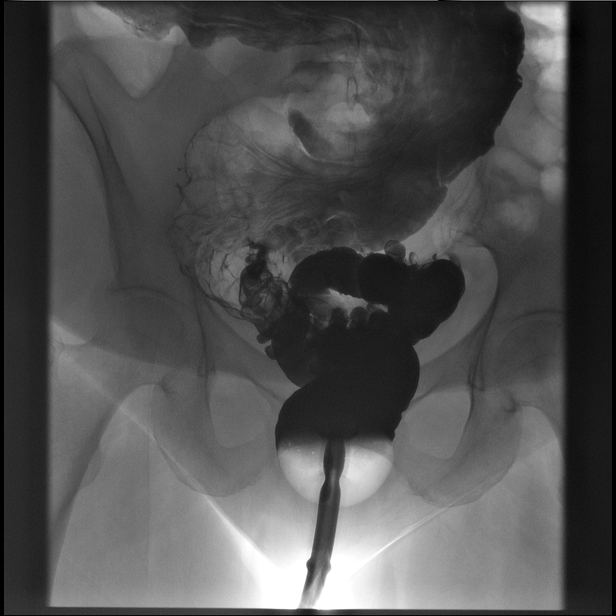

[Series 9: fluoro_barium singleshot_bb · 0.17mm/px · 1 of 1 slices shown (4 of 5)]
[im 1/1]
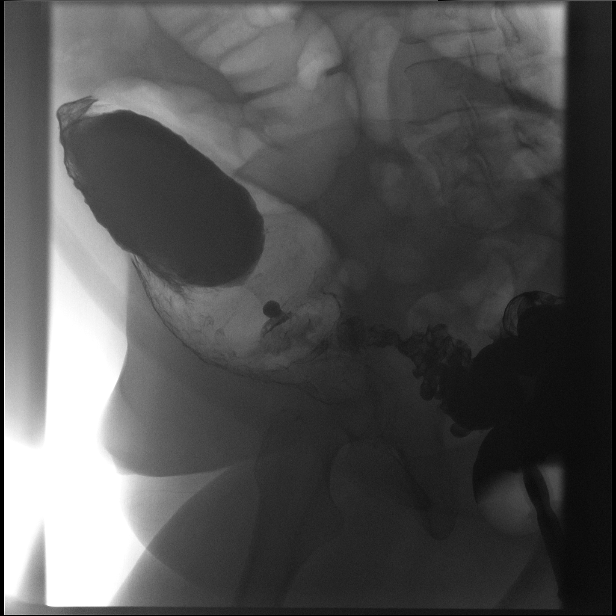

[Series 10: fluoro_barium singleshot_bb · 0.17mm/px · 1 of 1 slices shown (5 of 5)]
[im 1/1]
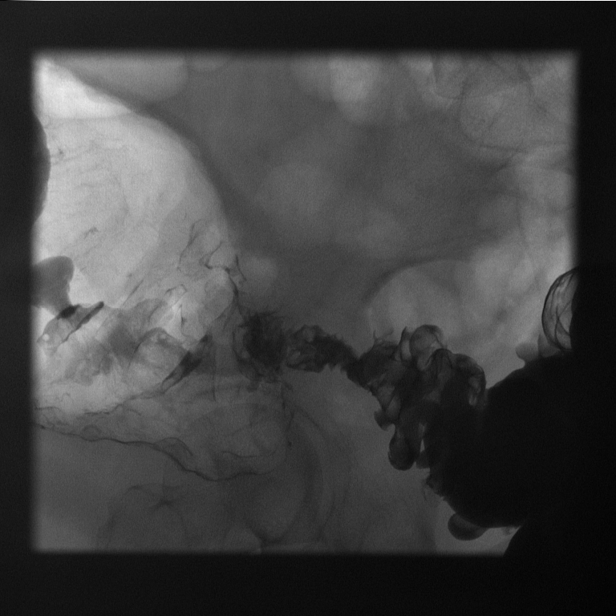

[Series 11: cp_standard · 0.17mm/px · 1 of 1 slices shown (4 of 4)]
[im 1/1]
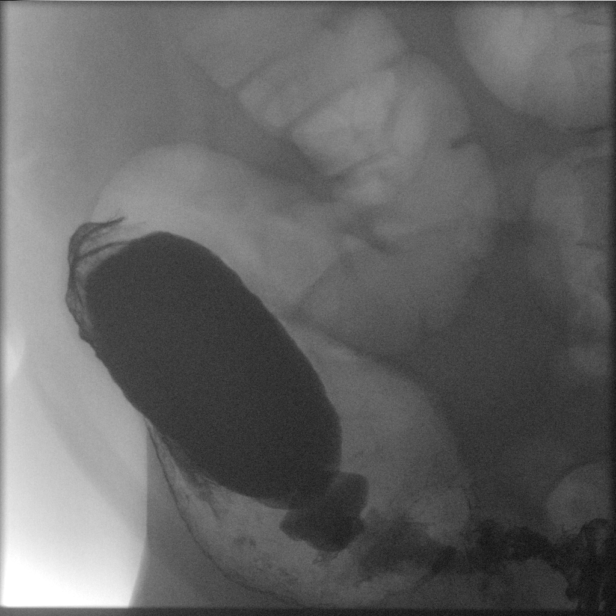

[Series 12: t abdomen supine · 0.14mm/px · 1 of 1 slices shown (3 of 4)]
[im 1/1]
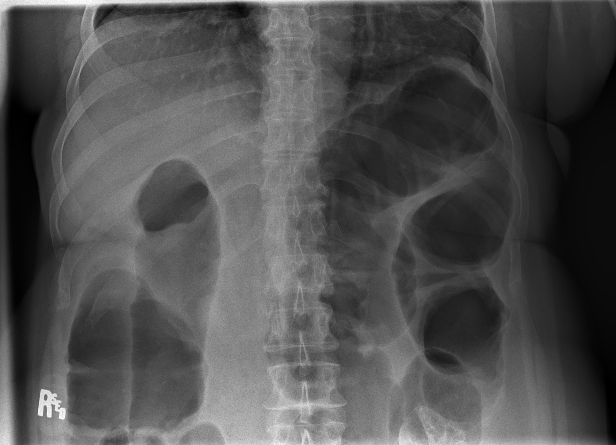

[Series 13: t abdomen supine · 0.14mm/px · 1 of 1 slices shown (4 of 4)]
[im 1/1]
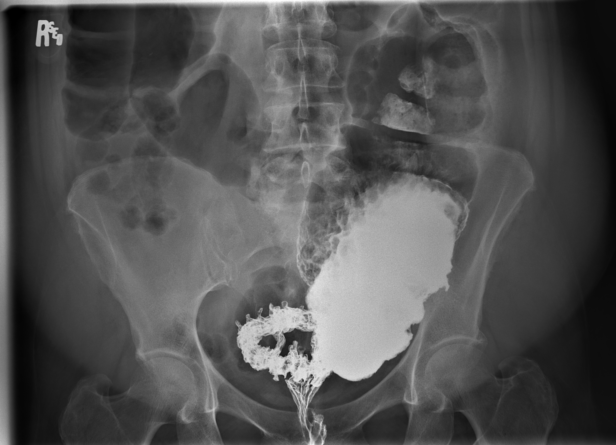

[13 of 13 positions shown; findings below may reference images not displayed]

FINDINGS: Scout radiographs of the abdomen demonstrate dilated, air-filled
colon to the level of the sigmoid. There is an area of
circumferential narrowing of the sigmoid colon measuring
approximately 4.6 cm in length maximal. Lumen is focally narrowed to
5 mm. Contrast does pass through this area into the more proximal
sigmoid. Scattered diverticula noted.
IMPRESSION: Segmental circumferential narrowing of the sigmoid colon as
described with proximal colonic dilatation. Scattered diverticula
noted. There are no substantial acute inflammatory changes on the
prior CT and tissue sampling is recommended when possible to
evaluate for malignancy.

## 2023-07-26 DIAGNOSIS — I700 Atherosclerosis of aorta: Secondary | ICD-10-CM | POA: Diagnosis not present

## 2023-07-26 DIAGNOSIS — Z6835 Body mass index (BMI) 35.0-35.9, adult: Secondary | ICD-10-CM | POA: Diagnosis not present

## 2023-07-26 DIAGNOSIS — E669 Obesity, unspecified: Secondary | ICD-10-CM | POA: Diagnosis not present

## 2023-07-26 DIAGNOSIS — E538 Deficiency of other specified B group vitamins: Secondary | ICD-10-CM | POA: Diagnosis not present

## 2023-07-26 DIAGNOSIS — E785 Hyperlipidemia, unspecified: Secondary | ICD-10-CM | POA: Diagnosis not present

## 2023-08-08 DIAGNOSIS — Z1231 Encounter for screening mammogram for malignant neoplasm of breast: Secondary | ICD-10-CM | POA: Diagnosis not present

## 2023-08-27 DIAGNOSIS — R0981 Nasal congestion: Secondary | ICD-10-CM | POA: Diagnosis not present

## 2023-08-27 DIAGNOSIS — R5383 Other fatigue: Secondary | ICD-10-CM | POA: Diagnosis not present

## 2023-08-27 DIAGNOSIS — Z1152 Encounter for screening for COVID-19: Secondary | ICD-10-CM | POA: Diagnosis not present

## 2023-08-27 DIAGNOSIS — J0100 Acute maxillary sinusitis, unspecified: Secondary | ICD-10-CM | POA: Diagnosis not present

## 2023-09-11 DIAGNOSIS — E785 Hyperlipidemia, unspecified: Secondary | ICD-10-CM | POA: Diagnosis not present

## 2023-09-11 DIAGNOSIS — D539 Nutritional anemia, unspecified: Secondary | ICD-10-CM | POA: Diagnosis not present

## 2023-09-11 DIAGNOSIS — Z1389 Encounter for screening for other disorder: Secondary | ICD-10-CM | POA: Diagnosis not present

## 2023-09-11 DIAGNOSIS — E039 Hypothyroidism, unspecified: Secondary | ICD-10-CM | POA: Diagnosis not present

## 2023-09-11 DIAGNOSIS — Z1212 Encounter for screening for malignant neoplasm of rectum: Secondary | ICD-10-CM | POA: Diagnosis not present

## 2023-09-11 DIAGNOSIS — E559 Vitamin D deficiency, unspecified: Secondary | ICD-10-CM | POA: Diagnosis not present

## 2023-09-11 DIAGNOSIS — E538 Deficiency of other specified B group vitamins: Secondary | ICD-10-CM | POA: Diagnosis not present

## 2023-09-18 DIAGNOSIS — Z6835 Body mass index (BMI) 35.0-35.9, adult: Secondary | ICD-10-CM | POA: Diagnosis not present

## 2023-09-18 DIAGNOSIS — E669 Obesity, unspecified: Secondary | ICD-10-CM | POA: Diagnosis not present

## 2023-09-18 DIAGNOSIS — G4739 Other sleep apnea: Secondary | ICD-10-CM | POA: Diagnosis not present

## 2023-09-18 DIAGNOSIS — Z0000 Encounter for general adult medical examination without abnormal findings: Secondary | ICD-10-CM | POA: Diagnosis not present

## 2023-09-18 DIAGNOSIS — E538 Deficiency of other specified B group vitamins: Secondary | ICD-10-CM | POA: Diagnosis not present

## 2023-09-18 DIAGNOSIS — R82998 Other abnormal findings in urine: Secondary | ICD-10-CM | POA: Diagnosis not present

## 2023-09-18 DIAGNOSIS — I2510 Atherosclerotic heart disease of native coronary artery without angina pectoris: Secondary | ICD-10-CM | POA: Diagnosis not present

## 2023-09-18 DIAGNOSIS — E559 Vitamin D deficiency, unspecified: Secondary | ICD-10-CM | POA: Diagnosis not present

## 2023-09-18 DIAGNOSIS — N1831 Chronic kidney disease, stage 3a: Secondary | ICD-10-CM | POA: Diagnosis not present

## 2023-09-18 DIAGNOSIS — I700 Atherosclerosis of aorta: Secondary | ICD-10-CM | POA: Diagnosis not present

## 2023-09-18 DIAGNOSIS — R946 Abnormal results of thyroid function studies: Secondary | ICD-10-CM | POA: Diagnosis not present

## 2023-12-31 DIAGNOSIS — N390 Urinary tract infection, site not specified: Secondary | ICD-10-CM | POA: Diagnosis not present

## 2024-04-15 DIAGNOSIS — E538 Deficiency of other specified B group vitamins: Secondary | ICD-10-CM | POA: Diagnosis not present

## 2024-05-07 DIAGNOSIS — E875 Hyperkalemia: Secondary | ICD-10-CM | POA: Diagnosis not present

## 2024-08-19 DIAGNOSIS — Z1231 Encounter for screening mammogram for malignant neoplasm of breast: Secondary | ICD-10-CM | POA: Diagnosis not present

## 2024-08-21 DIAGNOSIS — Z1152 Encounter for screening for COVID-19: Secondary | ICD-10-CM | POA: Diagnosis not present

## 2024-08-21 DIAGNOSIS — J019 Acute sinusitis, unspecified: Secondary | ICD-10-CM | POA: Diagnosis not present

## 2024-08-21 DIAGNOSIS — R0981 Nasal congestion: Secondary | ICD-10-CM | POA: Diagnosis not present

## 2024-09-22 DIAGNOSIS — M8589 Other specified disorders of bone density and structure, multiple sites: Secondary | ICD-10-CM | POA: Diagnosis not present

## 2024-09-22 DIAGNOSIS — E039 Hypothyroidism, unspecified: Secondary | ICD-10-CM | POA: Diagnosis not present

## 2024-09-22 DIAGNOSIS — Z1212 Encounter for screening for malignant neoplasm of rectum: Secondary | ICD-10-CM | POA: Diagnosis not present

## 2024-09-22 DIAGNOSIS — E538 Deficiency of other specified B group vitamins: Secondary | ICD-10-CM | POA: Diagnosis not present

## 2024-09-22 DIAGNOSIS — E785 Hyperlipidemia, unspecified: Secondary | ICD-10-CM | POA: Diagnosis not present

## 2024-09-22 DIAGNOSIS — I2510 Atherosclerotic heart disease of native coronary artery without angina pectoris: Secondary | ICD-10-CM | POA: Diagnosis not present

## 2024-09-22 DIAGNOSIS — D539 Nutritional anemia, unspecified: Secondary | ICD-10-CM | POA: Diagnosis not present

## 2024-09-22 DIAGNOSIS — E559 Vitamin D deficiency, unspecified: Secondary | ICD-10-CM | POA: Diagnosis not present

## 2024-09-29 DIAGNOSIS — E785 Hyperlipidemia, unspecified: Secondary | ICD-10-CM | POA: Diagnosis not present

## 2024-09-29 DIAGNOSIS — R82998 Other abnormal findings in urine: Secondary | ICD-10-CM | POA: Diagnosis not present
